# Patient Record
Sex: Female | Born: 1955 | Race: White | Hispanic: No | Marital: Married | Smoking: Never smoker
Health system: Southern US, Community
[De-identification: ages and names within clinical notes are randomized; demographics above are authoritative.]

## PROBLEM LIST (undated history)

## (undated) DIAGNOSIS — M810 Age-related osteoporosis without current pathological fracture: Secondary | ICD-10-CM

## (undated) DIAGNOSIS — C4490 Unspecified malignant neoplasm of skin, unspecified: Secondary | ICD-10-CM

## (undated) DIAGNOSIS — E8809 Other disorders of plasma-protein metabolism, not elsewhere classified: Secondary | ICD-10-CM

## (undated) DIAGNOSIS — M1990 Unspecified osteoarthritis, unspecified site: Secondary | ICD-10-CM

## (undated) DIAGNOSIS — N951 Menopausal and female climacteric states: Secondary | ICD-10-CM

## (undated) DIAGNOSIS — Z973 Presence of spectacles and contact lenses: Secondary | ICD-10-CM

## (undated) HISTORY — PX: OTHER SURGICAL HISTORY: SHX169

## (undated) HISTORY — DX: Unspecified malignant neoplasm of skin, unspecified: C44.90

## (undated) HISTORY — DX: Other disorders of plasma-protein metabolism, not elsewhere classified: E88.09

## (undated) HISTORY — PX: COLONOSCOPY: SHX174

## (undated) HISTORY — DX: Menopausal and female climacteric states: N95.1

## (undated) HISTORY — DX: Age-related osteoporosis without current pathological fracture: M81.0

---

## 1998-03-15 ENCOUNTER — Other Ambulatory Visit: Admission: RE | Admit: 1998-03-15 | Discharge: 1998-03-15 | Payer: Self-pay | Admitting: *Deleted

## 2000-02-05 ENCOUNTER — Other Ambulatory Visit: Admission: RE | Admit: 2000-02-05 | Discharge: 2000-02-05 | Payer: Self-pay | Admitting: *Deleted

## 2002-12-18 ENCOUNTER — Other Ambulatory Visit: Admission: RE | Admit: 2002-12-18 | Discharge: 2002-12-18 | Payer: Self-pay | Admitting: Obstetrics and Gynecology

## 2005-02-04 ENCOUNTER — Other Ambulatory Visit: Admission: RE | Admit: 2005-02-04 | Discharge: 2005-02-04 | Payer: Self-pay | Admitting: Obstetrics and Gynecology

## 2006-10-21 ENCOUNTER — Other Ambulatory Visit: Admission: RE | Admit: 2006-10-21 | Discharge: 2006-10-21 | Payer: Self-pay | Admitting: Obstetrics and Gynecology

## 2007-10-24 ENCOUNTER — Other Ambulatory Visit: Admission: RE | Admit: 2007-10-24 | Discharge: 2007-10-24 | Payer: Self-pay | Admitting: Obstetrics and Gynecology

## 2010-04-01 LAB — HM DEXA SCAN

## 2010-05-23 ENCOUNTER — Other Ambulatory Visit: Payer: Self-pay | Admitting: *Deleted

## 2010-05-23 ENCOUNTER — Ambulatory Visit
Admission: RE | Admit: 2010-05-23 | Discharge: 2010-05-23 | Disposition: A | Discharge status: Home / Self Care | Payer: Self-pay | Source: Ambulatory Visit | Attending: *Deleted | Admitting: *Deleted

## 2010-05-23 DIAGNOSIS — M542 Cervicalgia: Secondary | ICD-10-CM

## 2012-02-26 ENCOUNTER — Ambulatory Visit: Payer: BC Managed Care – PPO

## 2012-02-26 ENCOUNTER — Ambulatory Visit (INDEPENDENT_AMBULATORY_CARE_PROVIDER_SITE_OTHER): Payer: BC Managed Care – PPO | Admitting: Family Medicine

## 2012-02-26 VITALS — BP 131/80 | HR 92 | Temp 98.6°F | Resp 16 | Ht 64.0 in | Wt 114.8 lb

## 2012-02-26 DIAGNOSIS — R05 Cough: Secondary | ICD-10-CM

## 2012-02-26 DIAGNOSIS — R059 Cough, unspecified: Secondary | ICD-10-CM

## 2012-02-26 DIAGNOSIS — J40 Bronchitis, not specified as acute or chronic: Secondary | ICD-10-CM

## 2012-02-26 MED ORDER — HYDROCOD POLST-CHLORPHEN POLST 10-8 MG/5ML PO LQCR: 5.0000 mL | Freq: Two times a day (BID) | ORAL | Status: DC | PRN

## 2012-02-26 MED ORDER — AZITHROMYCIN 250 MG PO TABS: ORAL_TABLET | ORAL | Status: DC

## 2012-02-26 NOTE — Progress Notes (Signed)
  Subjective:    Patient ID: Karen Campos, female    DOB: 1956/02/06, 56 y.o.   MRN: [id number]  HPI 56 year old female presents with 1 week history of cough, nasal congestion, postnasal drip, and chest tightness.  Denies fever, chills, nausea, vomiting, otalgia, sore throat, sinus pain/pressure.  She has been taking Nyquil and Robitussin which have helped some, but is concerned since this has persisted.  She is otherwise healthy with no other complaints today.      Review of Systems  Constitutional: Negative for fever and chills.  HENT: Positive for congestion, rhinorrhea and postnasal drip. Negative for sore throat.   Respiratory: Positive for cough and chest tightness. Negative for shortness of breath and wheezing.   Cardiovascular: Negative for chest pain.  Gastrointestinal: Negative for nausea and vomiting.  All other systems reviewed and are negative.       Objective:   Physical Exam  Constitutional: She is oriented to person, place, and time. She appears well-developed and well-nourished.  HENT:  Head: Normocephalic and atraumatic.  Right Ear: Hearing, tympanic membrane, external ear and ear canal normal.  Left Ear: Hearing, tympanic membrane, external ear and ear canal normal.  Nose: Right sinus exhibits no maxillary sinus tenderness and no frontal sinus tenderness. Left sinus exhibits no maxillary sinus tenderness and no frontal sinus tenderness.  Mouth/Throat: Uvula is midline, oropharynx is clear and moist and mucous membranes are normal. No oropharyngeal exudate.  Eyes: Conjunctivae normal are normal.  Neck: Normal range of motion.  Cardiovascular: Normal rate, regular rhythm and normal heart sounds.   Pulmonary/Chest: Effort normal and breath sounds normal.  Lymphadenopathy:    She has no cervical adenopathy.  Neurological: She is alert and oriented to person, place, and time.  Psychiatric: She has a normal mood and affect. Her behavior is normal. Judgment and  thought content normal.    [facility] reading (PRIMARY) by  Dr. Clelia Croft as normal chest x-ray.        Assessment & Plan:   1. Bronchitis  azithromycin (ZITHROMAX) 250 MG tablet  2. Cough  DG Chest 2 View, chlorpheniramine-HYDROcodone (TUSSIONEX PENNKINETIC ER) 10-8 MG/5ML LQCR  Likely post viral cough Will cover bronchitis with Zpack Tussionex qhs prn cough Increase fluids and rest Follow up if symptoms worsen or fail to improve

## 2012-07-07 NOTE — Progress Notes (Signed)
Reviewed documentation and xray and agree w/ assessment and plan. Aja Whitehair, MD MPH   

## 2012-08-30 ENCOUNTER — Encounter: Payer: Self-pay | Admitting: Nurse Practitioner

## 2012-09-06 ENCOUNTER — Encounter: Payer: Self-pay | Admitting: *Deleted

## 2012-09-13 ENCOUNTER — Other Ambulatory Visit: Payer: BC Managed Care – PPO

## 2012-09-13 ENCOUNTER — Ambulatory Visit (INDEPENDENT_AMBULATORY_CARE_PROVIDER_SITE_OTHER): Payer: BC Managed Care – PPO | Admitting: Nurse Practitioner

## 2012-09-13 ENCOUNTER — Encounter: Payer: Self-pay | Admitting: Nurse Practitioner

## 2012-09-13 ENCOUNTER — Ambulatory Visit: Payer: BC Managed Care – PPO | Admitting: Nurse Practitioner

## 2012-09-13 VITALS — BP 102/60 | HR 68 | Resp 12 | Ht 63.75 in | Wt 117.6 lb

## 2012-09-13 DIAGNOSIS — Z0000 Encounter for general adult medical examination without abnormal findings: Secondary | ICD-10-CM

## 2012-09-13 DIAGNOSIS — E559 Vitamin D deficiency, unspecified: Secondary | ICD-10-CM

## 2012-09-13 DIAGNOSIS — Z01419 Encounter for gynecological examination (general) (routine) without abnormal findings: Secondary | ICD-10-CM

## 2012-09-13 LAB — POCT URINALYSIS DIPSTICK
Leukocytes, UA: NEGATIVE
pH, UA: 6.5

## 2012-09-13 LAB — HEMOGLOBIN, FINGERSTICK: Hemoglobin, fingerstick: 15 g/dL (ref 12.0–16.0)

## 2012-09-13 NOTE — Progress Notes (Signed)
57 y.o. G2P2 Married Caucasian Fe here for annual exam.  She feels well no new health concerns.  Wants to make sure OTC Vit D is not too much; wants recheck level.  No LMP recorded. Patient is postmenopausal.          Sexually active: yes  The current method of family planning is post menopausal status.    Exercising: no  The patient does not participate in regular exercise at present. Smoker:  no  Health Maintenance: Pap:  11/04/2009  Normal  MMG:  04/18/2012 normal Colonoscopy:  2012 normal repeat in 10 years BMD:   2012  TDaP:  2012 Labs: Hgb- 15.0   reports that she has never smoked. She has never used smokeless tobacco. She reports that she does not drink alcohol or use illicit drugs.  Past Medical History  Diagnosis Date  . Perimenopausal     Past Surgical History  Procedure Laterality Date  . Vaginal delivery      x2    Current Outpatient Prescriptions  Medication Sig Dispense Refill  . aspirin 81 MG tablet Take 81 mg by mouth daily.      . calcium carbonate 200 MG capsule Take 250 mg by mouth daily.      . fish oil-omega-3 fatty acids 1000 MG capsule Take 2 g by mouth daily.      Marland Kitchen glucosamine-chondroitin 500-400 MG tablet Take 1 tablet by mouth 3 (three) times daily.      . Multiple Vitamin (MULTIVITAMIN) capsule Take 1 capsule by mouth daily.       No current facility-administered medications for this visit.    Family History  Problem Relation Age of Onset  . Thyroid disease Mother   . Glaucoma Mother   . Heart disease Father   . Heart disease Paternal Aunt   . Breast cancer Paternal Grandmother     ROS:  Pertinent items are noted in HPI.  Otherwise, a comprehensive ROS was negative.  Exam:   BP 102/60  Pulse 68  Resp 12  Ht 5' 3.75" (1.619 m)  Wt 117 lb 9.6 oz (53.343 kg)  BMI 20.35 kg/m2 Height: 5' 3.75" (161.9 cm)  Ht Readings from Last 3 Encounters:  09/13/12 5' 3.75" (1.619 m)  02/26/12 5\' 4"  (1.626 m)    General appearance: alert,  cooperative and appears stated age Head: Normocephalic, without obvious abnormality, atraumatic Neck: no adenopathy, supple, symmetrical, trachea midline and thyroid normal to inspection and palpation Lungs: clear to auscultation bilaterally Breasts: normal appearance, no masses or tenderness Heart: regular rate and rhythm Abdomen: soft, non-tender; no masses,  no organomegaly Extremities: extremities normal, atraumatic, no cyanosis or edema Skin: Skin color, texture, turgor normal. No rashes or lesions Lymph nodes: Cervical, supraclavicular, and axillary nodes normal. No abnormal inguinal nodes palpated Neurologic: Grossly normal   Pelvic: External genitalia:  no lesions              Urethra:  normal appearing urethra with no masses, tenderness or lesions              Bartholin's and Skene's: normal                 Vagina: normal appearing vagina with normal color and discharge, no lesions              Cervix: anteverted              Pap taken: yes with HR HPV Bimanual Exam:  Uterus:  normal size,  contour, position, consistency, mobility, non-tender              Adnexa: no mass, fullness, tenderness               Rectovaginal: Confirms               Anus:  normal sphincter tone, no lesions  A:  Well Woman with normal exam  Postmenopausal no HRT  Osteopenia - decided with PCP not to do treatment at this time secondary to SE  History of Vit D deficiency  P:   Pap smear as per guidelines   Mammogram due 03/2013  Recheck Vit D, follow with pap  counseled on osteoporosis, adequate intake of calcium and vitamin D  diet and exercise, Kegel's exercises return annually or prn  An After Visit Summary was printed and given to the patient.

## 2012-09-13 NOTE — Patient Instructions (Signed)

## 2012-09-15 NOTE — Progress Notes (Signed)
Encounter reviewed by Dr. Cleavon Goldman Silva.  

## 2012-09-16 ENCOUNTER — Telehealth: Payer: Self-pay | Admitting: Orthopedic Surgery

## 2012-09-16 NOTE — Telephone Encounter (Signed)
Message copied by Alfredo Batty on Fri Sep 16, 2012  5:05 PM ------      Message from: Conley Simmonds      Created: Fri Sep 16, 2012  6:29 AM      Regarding: Results       Please inform the patient to reduce her over the counter Vitamin D level.      I do not see a current dosage on her encounter.      A regular multivitamin with Vitamin D in it is likely to be adequate.            ----- Message -----         From: Rhett Bannister, CMA         Sent: 09/13/2012   9:38 AM           To: Lauro Franklin, FNP                   ------

## 2012-09-16 NOTE — Telephone Encounter (Signed)
Spoke with Karen Campos to inform her Vit D was a little high. Karen Campos takes MVI and fish oil, but no Vit D by itself. Should she discontinue one of them to reduce level? Please advise.

## 2012-09-19 NOTE — Telephone Encounter (Signed)
LM on VM for pt to not take MVI for the next few summer months to allow Vit D level to come down some. Pt to call back with any questions.

## 2012-09-19 NOTE — Telephone Encounter (Signed)
Have her to hold the multivitamin as most women's multi will have vit D. At least for the summer months.

## 2012-09-20 ENCOUNTER — Telehealth: Payer: Self-pay | Admitting: *Deleted

## 2012-09-20 NOTE — Telephone Encounter (Signed)
Patient returning Tiffany's call.

## 2012-09-20 NOTE — Telephone Encounter (Signed)
Message copied by Osie Bond on Tue Sep 20, 2012  4:38 PM ------      Message from: Ria Comment R      Created: Mon Sep 19, 2012  1:24 PM       Pap 02 ------

## 2012-09-20 NOTE — Telephone Encounter (Signed)
Pt is aware of vitamin d lab results and is agreeable to dc'd all vitamin d intake (supplements).

## 2012-09-20 NOTE — Telephone Encounter (Signed)
Message copied by Osie Bond on Tue Sep 20, 2012  9:29 AM ------      Message from: Ria Comment R      Created: Mon Sep 19, 2012  1:24 PM       Pap 02 ------

## 2012-09-20 NOTE — Telephone Encounter (Signed)
LVM for pt to return my call in regards to lab results.  

## 2012-09-22 ENCOUNTER — Ambulatory Visit: Payer: BC Managed Care – PPO | Admitting: Nurse Practitioner

## 2013-08-07 ENCOUNTER — Encounter: Payer: Self-pay | Admitting: Nurse Practitioner

## 2013-08-25 ENCOUNTER — Emergency Department (HOSPITAL_BASED_OUTPATIENT_CLINIC_OR_DEPARTMENT_OTHER)
Admission: EM | Admit: 2013-08-25 | Discharge: 2013-08-25 | Disposition: A | Discharge status: Home / Self Care | Payer: BC Managed Care – PPO | Attending: Emergency Medicine | Admitting: Emergency Medicine

## 2013-08-25 ENCOUNTER — Encounter (HOSPITAL_BASED_OUTPATIENT_CLINIC_OR_DEPARTMENT_OTHER): Payer: Self-pay | Admitting: Emergency Medicine

## 2013-08-25 ENCOUNTER — Emergency Department (HOSPITAL_BASED_OUTPATIENT_CLINIC_OR_DEPARTMENT_OTHER): Payer: BC Managed Care – PPO

## 2013-08-25 DIAGNOSIS — Z79899 Other long term (current) drug therapy: Secondary | ICD-10-CM | POA: Insufficient documentation

## 2013-08-25 DIAGNOSIS — Z7982 Long term (current) use of aspirin: Secondary | ICD-10-CM | POA: Insufficient documentation

## 2013-08-25 DIAGNOSIS — M7530 Calcific tendinitis of unspecified shoulder: Secondary | ICD-10-CM

## 2013-08-25 DIAGNOSIS — Z880 Allergy status to penicillin: Secondary | ICD-10-CM | POA: Insufficient documentation

## 2013-08-25 MED ORDER — HYDROCODONE-ACETAMINOPHEN 5-325 MG PO TABS: 2.0000 | ORAL_TABLET | ORAL | Status: DC | PRN

## 2013-08-25 MED ORDER — TRIAMCINOLONE ACETONIDE 40 MG/ML IJ SUSP
INTRAMUSCULAR | Status: AC
  Filled 2013-08-25: qty 5

## 2013-08-25 MED ORDER — TRIAMCINOLONE ACETONIDE 40 MG/ML IJ SUSP
40.0000 mg | Freq: Once | INTRAMUSCULAR | Status: AC
  Administered 2013-08-25: 40 mg via INTRA_ARTICULAR

## 2013-08-25 NOTE — ED Notes (Signed)
Patient transported to X-ray 

## 2013-08-25 NOTE — ED Notes (Signed)
MD at bedside. 

## 2013-08-25 NOTE — ED Notes (Signed)
Pt reports right shoulder pain that started suddenly Wednesday night - denies known injury.

## 2013-08-25 NOTE — Discharge Instructions (Signed)
Calcific Tendinitis Calcific tendinitis occurs when crystals of calcium are deposited in a tendon. Tendons are bands of strong, fibrous tissue that attach muscles to bones. Tendons are an important part of joints. They make the joint move and they absorb some of the stress that a joint receives during use. When calcium is deposited in the tendon, the tendon becomes stiff, painful, and it can become swollen. Calcific tendinitis occurs frequently in the shoulder joint, in a structure called the rotator cuff. CAUSES  The cause of calcific tendinitis is unclear. It may be associated with:  Overuse of the tendon, such as from repetitive motion.  Excess stress on the tendon.  Aging.  Repetitive, mild injuries. SYMPTOMS   Pain may or may not be present. If it is present, it may occur when moving the joint.  Tenderness when pressure is applied to the tendon.  A snapping or popping sound when the joint moves.  Decreased motion of the joint.  Difficulty sleeping due to pain in the joint. DIAGNOSIS  Your caregiver will perform a physical exam. Imaging tests may also be used to make the diagnosis. These may include X-rays, an MRI, or a CT scan. TREATMENT  Generally, calcific tendinitis resolves on its own. Treatment for pain of calcific tendinitis may include:  Taking over-the-counter medicines, such as anti-inflammatory drugs.  Applying ice packs to the joint.  Following a specific exercise program to keep the joint working properly.  Attending physical therapy sessions.  Avoiding activities that cause pain. Treatment for more severe calcific tendinitis may require:  Injecting cortisone steroids or pain relieving medicines into or around the joint.  Manipulating the joint after you are given medicine to numb the area (local anesthetic).  Inflating the joint with sterile fluid to increase the flexibility of the tendons.  Shockwave therapy, which involves focusing sounds waves on the  joint. If other treatments do not work, surgery may be done to clean out the calcium deposits and repair the tendons where needed. Most people do not need surgery. HOME CARE INSTRUCTIONS   Only take over-the-counter or prescription medicines for pain, fever, or discomfort as directed by your caregiver.  Follow your caregiver's recommendations for activity and exercise. SEEK MEDICAL CARE IF:  You notice an increase in pain or numbness.  You develop new weakness.  You notice increased joint stiffness or a sensation of looseness in the joint.  You notice increasing redness, swelling, or warmth around the joint area. SEEK IMMEDIATE MEDICAL CARE IF:  You have a fever or persistent symptoms for more than 2 to 3 days.  You have a fever and your symptoms suddenly get worse. MAKE SURE YOU:  Understand these instructions.  Will watch your condition.  Will get help right away if you are not doing well or get worse. Document Released: 12/24/2007 Document Revised: 09/15/2011 Document Reviewed: 06/25/2011 [facility] Patient Information 2014 [place].

## 2013-08-28 NOTE — ED Provider Notes (Addendum)
CSN: [id number]     Arrival date & time 08/25/13  1000 History   First MD Initiated Contact with Patient 08/25/13 1014     Chief Complaint  Patient presents with  . Shoulder Pain     HPI Patient complains of increasing right shoulder pain which are Wednesday.  No known injury or fall.  No fever or or heat to the area.  Pain primarily with abduction of shoulder. Past Medical History  Diagnosis Date  . Perimenopausal    Past Surgical History  Procedure Laterality Date  . Vaginal delivery      x2   Family History  Problem Relation Age of Onset  . Thyroid disease Mother   . Glaucoma Mother   . Heart disease Father   . Heart disease Paternal Aunt   . Breast cancer Paternal Grandmother    History  Substance Use Topics  . Smoking status: Never Smoker   . Smokeless tobacco: Never Used  . Alcohol Use: No   OB History   Grav Para Term Preterm Abortions TAB SAB Ect Mult Living   2 2        2      Review of Systems  All other systems reviewed and are negative  Allergies  Penicillins  Home Medications   Prior to Admission medications   Medication Sig Start Date End Date Taking? Authorizing Provider  aspirin 81 MG tablet Take 81 mg by mouth daily.   Yes Historical Provider, MD  calcium carbonate 200 MG capsule Take 250 mg by mouth daily.   Yes Historical Provider, MD  estrogens conjugated, synthetic A, (CENESTIN) 0.3 MG tablet Take 0.3 mg by mouth daily.   Yes Historical Provider, MD  fish oil-omega-3 fatty acids 1000 MG capsule Take 2 g by mouth daily.   Yes Historical Provider, MD  glucosamine-chondroitin 500-400 MG tablet Take 1 tablet by mouth 3 (three) times daily.   Yes Historical Provider, MD  Multiple Vitamin (MULTIVITAMIN) capsule Take 1 capsule by mouth daily.   Yes Historical Provider, MD  HYDROcodone-acetaminophen (NORCO/VICODIN) 5-325 MG per tablet Take 2 tablets by mouth every 4 (four) hours as needed. 08/25/13   Dot Lanes, MD   BP 150/88  Pulse 84   Temp(Src) 99 F (37.2 C) (Oral)  Resp 18  Ht 5\' 5"  (1.651 m)  Wt 120 lb (54.432 kg)  BMI 19.97 kg/m2  SpO2 100% Physical Exam  Nursing note and vitals reviewed. Constitutional: She is oriented to person, place, and time. She appears well-developed and well-nourished. No distress.  HENT:  Head: Normocephalic and atraumatic.  Eyes: Pupils are equal, round, and reactive to light.  Neck: Normal range of motion.  Cardiovascular: Normal rate and intact distal pulses.   Pulmonary/Chest: No respiratory distress.  Abdominal: Normal appearance. She exhibits no distension.  Musculoskeletal:       Right shoulder: She exhibits decreased range of motion, tenderness and pain. She exhibits no swelling and no deformity.  Decreased ability to abduct shoulder  Neurological: She is alert and oriented to person, place, and time. No cranial nerve deficit.  Skin: Skin is warm and dry. No rash noted.  Psychiatric: She has a normal mood and affect. Her behavior is normal.    ED Course  Injection tendon or ligament Performed by: Dot Lanes Authorized by: Dot Lanes Consent: Verbal consent obtained. written consent not obtained. Risks and benefits: risks, benefits and alternatives were discussed Consent given by: patient Patient understanding: patient states understanding of the  procedure being performed Patient consent: the patient's understanding of the procedure matches consent given Procedure consent: procedure consent matches procedure scheduled Relevant documents: relevant documents present and verified Test results: test results available and properly labeled Site marked: the operative site was marked Imaging studies: imaging studies available Patient identity confirmed: verbally with patient Time out: Immediately prior to procedure a "time out" was called to verify the correct patient, procedure, equipment, support staff and site/side marked as required. Preparation: Patient was  prepped and draped in the usual sterile fashion. Local anesthesia used: no Patient sedated: no Patient tolerance: Patient tolerated the procedure well with no immediate complications.   (including critical care time)  Medications  triamcinolone acetonide (KENALOG-40) injection 40 mg (40 mg Intra-articular Given by Other 08/25/13 1101)     Labs Review Labs Reviewed - No data to display  Imaging Review IMPRESSION: Evidence of calcific tendinosis laterally. No appreciable joint space narrowing. No fracture or dislocation.     MDM   Final diagnoses:  Calcific tendinitis of shoulder        Dot Lanes, MD 08/28/13 [id number]  Dot Lanes, MD 08/28/13 [id number]

## 2013-09-14 ENCOUNTER — Ambulatory Visit: Payer: BC Managed Care – PPO | Admitting: Nurse Practitioner

## 2013-09-25 ENCOUNTER — Encounter: Payer: Self-pay | Admitting: Nurse Practitioner

## 2013-09-25 ENCOUNTER — Ambulatory Visit (INDEPENDENT_AMBULATORY_CARE_PROVIDER_SITE_OTHER): Payer: BC Managed Care – PPO | Admitting: Nurse Practitioner

## 2013-09-25 VITALS — BP 120/78 | HR 76 | Ht 63.75 in | Wt 120.0 lb

## 2013-09-25 DIAGNOSIS — Z01419 Encounter for gynecological examination (general) (routine) without abnormal findings: Secondary | ICD-10-CM

## 2013-09-25 DIAGNOSIS — Z0000 Encounter for general adult medical examination without abnormal findings: Secondary | ICD-10-CM

## 2013-09-25 DIAGNOSIS — R319 Hematuria, unspecified: Secondary | ICD-10-CM

## 2013-09-25 LAB — POCT URINALYSIS DIPSTICK
Bilirubin, UA: NEGATIVE
GLUCOSE UA: NEGATIVE
Ketones, UA: NEGATIVE
Leukocytes, UA: NEGATIVE
NITRITE UA: NEGATIVE
Protein, UA: NEGATIVE
UROBILINOGEN UA: NEGATIVE
pH, UA: 5

## 2013-09-25 NOTE — Patient Instructions (Signed)

## 2013-09-25 NOTE — Progress Notes (Signed)
58 y.o. G2P2 Married Caucasian Fe here for annual exam. Some vaso symptoms but is better with Black Cohash and Soy.  Uses OTC lubrication. Will return for fasting labs. First grand daughter is now 61 months old.  Patient's last menstrual period was 04/30/2008.          Sexually active: Yes.    The current method of family planning is none.    Exercising: No.  The patient does not participate in regular exercise at present. Smoker:  no  Health Maintenance: Pap:  09/13/12, Normal MMG:  04/18/2012 Colonoscopy:  2012, Normal recheck in 10 years BMD: 04/01/2010  -0.3/-2.1/-1.3 TDaP:  11/04/2009 Labs: pt was not fasting. Will schedule to come back       Urine: RBC-Trace   reports that she has never smoked. She has never used smokeless tobacco. She reports that she does not drink alcohol or use illicit drugs.  Past Medical History  Diagnosis Date  . Perimenopausal     Past Surgical History  Procedure Laterality Date  . Vaginal delivery      x2    Current Outpatient Prescriptions  Medication Sig Dispense Refill  . aspirin 81 MG tablet Take 81 mg by mouth daily.      . Black Cohosh-SoyIsoflav-Magnol (ESTROVEN MENOPAUSE RELIEF) CAPS Take by mouth.      . calcium carbonate 200 MG capsule Take 250 mg by mouth daily.      . fish oil-omega-3 fatty acids 1000 MG capsule Take 2 g by mouth daily.      Marland Kitchen glucosamine-chondroitin 500-400 MG tablet Take 1 tablet by mouth 3 (three) times daily.      . Multiple Vitamin (MULTIVITAMIN) capsule Take 1 capsule by mouth daily.      Marland Kitchen OVER THE COUNTER MEDICATION Take by mouth 2 (two) times daily. Adrenal compound       No current facility-administered medications for this visit.    Family History  Problem Relation Age of Onset  . Thyroid disease Mother   . Glaucoma Mother   . Heart disease Father   . Heart disease Paternal Aunt   . Breast cancer Paternal Grandmother     ROS:  Pertinent items are noted in HPI.  Otherwise, a comprehensive ROS was  negative.  Exam:   BP 120/78  Pulse 76  Ht 5' 3.75" (1.619 m)  Wt 120 lb (54.432 kg)  BMI 20.77 kg/m2  LMP 04/30/2008 Height: 5' 3.75" (161.9 cm)  Ht Readings from Last 3 Encounters:  09/25/13 5' 3.75" (1.619 m)  08/25/13 5\' 5"  (1.651 m)  09/13/12 5' 3.75" (1.619 m)    General appearance: alert, cooperative and appears stated age Head: Normocephalic, without obvious abnormality, atraumatic Neck: no adenopathy, supple, symmetrical, trachea midline and thyroid normal to inspection and palpation Lungs: clear to auscultation bilaterally Breasts: normal appearance, no masses or tenderness Heart: regular rate and rhythm Abdomen: soft, non-tender; no masses,  no organomegaly Extremities: extremities normal, atraumatic, no cyanosis or edema Skin: Skin color, texture, turgor normal. No rashes or lesions Lymph nodes: Cervical, supraclavicular, and axillary nodes normal. No abnormal inguinal nodes palpated Neurologic: Grossly normal   Pelvic: External genitalia:  no lesions              Urethra:  normal appearing urethra with no masses, tenderness or lesions              Bartholin's and Skene's: normal  Vagina: normal appearing vagina with normal color and discharge, no lesions              Cervix: anteverted              Pap taken: No. Bimanual Exam:  Uterus:  normal size, contour, position, consistency, mobility, non-tender              Adnexa: no mass, fullness, tenderness               Rectovaginal: Confirms               Anus:  normal sphincter tone, no lesions  A:  Well Woman with normal exam  Postmenopausal - vaso symptoms are improved on Estroven  P:   Reviewed health and wellness pertinent to exam  Pap smear not taken today  Mammogram is due ? Now - will get records  Return for fasting labs  Counseled on breast self exam, mammography screening, adequate intake of calcium and vitamin D, diet and exercise, Kegel's exercises return annually or prn  An After  Visit Summary was printed and given to the patient.

## 2013-09-26 ENCOUNTER — Other Ambulatory Visit (INDEPENDENT_AMBULATORY_CARE_PROVIDER_SITE_OTHER): Payer: BC Managed Care – PPO

## 2013-09-26 DIAGNOSIS — Z0000 Encounter for general adult medical examination without abnormal findings: Secondary | ICD-10-CM

## 2013-09-26 LAB — URINALYSIS, MICROSCOPIC ONLY
Bacteria, UA: NONE SEEN
CRYSTALS: NONE SEEN
Casts: NONE SEEN
SQUAMOUS EPITHELIAL / LPF: NONE SEEN

## 2013-09-26 LAB — CBC WITH DIFFERENTIAL/PLATELET
Basophils Absolute: 0 10*3/uL (ref 0.0–0.1)
Basophils Relative: 0 % (ref 0–1)
EOS ABS: 0.1 10*3/uL (ref 0.0–0.7)
Eosinophils Relative: 2 % (ref 0–5)
HCT: 43.8 % (ref 36.0–46.0)
HEMOGLOBIN: 15.1 g/dL — AB (ref 12.0–15.0)
LYMPHS ABS: 0.8 10*3/uL (ref 0.7–4.0)
LYMPHS PCT: 20 % (ref 12–46)
MCH: 30.9 pg (ref 26.0–34.0)
MCHC: 34.5 g/dL (ref 30.0–36.0)
MCV: 89.6 fL (ref 78.0–100.0)
MONOS PCT: 8 % (ref 3–12)
Monocytes Absolute: 0.3 10*3/uL (ref 0.1–1.0)
NEUTROS PCT: 70 % (ref 43–77)
Neutro Abs: 2.7 10*3/uL (ref 1.7–7.7)
Platelets: 199 10*3/uL (ref 150–400)
RBC: 4.89 MIL/uL (ref 3.87–5.11)
RDW: 13.4 % (ref 11.5–15.5)
WBC: 3.8 10*3/uL — AB (ref 4.0–10.5)

## 2013-09-26 NOTE — Progress Notes (Signed)
Encounter reviewed by Dr. Brook Silva.  

## 2013-09-27 LAB — LIPID PANEL
CHOL/HDL RATIO: 2.2 ratio
CHOLESTEROL: 211 mg/dL — AB (ref 0–200)
HDL: 96 mg/dL (ref >39)
LDL Cholesterol: 105 mg/dL — ABNORMAL HIGH (ref 0–99)
TRIGLYCERIDES: 48 mg/dL (ref <150)
VLDL: 10 mg/dL (ref 0–40)

## 2013-09-27 LAB — COMPREHENSIVE METABOLIC PANEL
ALBUMIN: 4.8 g/dL (ref 3.5–5.2)
ALK PHOS: 56 U/L (ref 39–117)
ALT: 12 U/L (ref 0–35)
AST: 16 U/L (ref 0–37)
BILIRUBIN TOTAL: 0.6 mg/dL (ref 0.2–1.2)
BUN: 12 mg/dL (ref 6–23)
CO2: 29 mEq/L (ref 19–32)
Calcium: 9.9 mg/dL (ref 8.4–10.5)
Chloride: 104 mEq/L (ref 96–112)
Creat: 0.59 mg/dL (ref 0.50–1.10)
GLUCOSE: 77 mg/dL (ref 70–99)
POTASSIUM: 5.3 meq/L (ref 3.5–5.3)
Sodium: 140 mEq/L (ref 135–145)
Total Protein: 6.5 g/dL (ref 6.0–8.3)

## 2013-09-27 LAB — VITAMIN D 25 HYDROXY (VIT D DEFICIENCY, FRACTURES): Vit D, 25-Hydroxy: 80 ng/mL (ref 30–89)

## 2013-09-27 LAB — URINE CULTURE
Colony Count: NO GROWTH
Organism ID, Bacteria: NO GROWTH

## 2014-01-29 ENCOUNTER — Encounter: Payer: Self-pay | Admitting: Nurse Practitioner

## 2014-09-27 ENCOUNTER — Encounter: Payer: Self-pay | Admitting: Nurse Practitioner

## 2014-09-27 ENCOUNTER — Ambulatory Visit (INDEPENDENT_AMBULATORY_CARE_PROVIDER_SITE_OTHER): Payer: BC Managed Care – PPO | Admitting: Nurse Practitioner

## 2014-09-27 VITALS — BP 130/80 | HR 88 | Resp 16 | Ht 63.75 in | Wt 121.0 lb

## 2014-09-27 DIAGNOSIS — Z01419 Encounter for gynecological examination (general) (routine) without abnormal findings: Secondary | ICD-10-CM

## 2014-09-27 DIAGNOSIS — Z0000 Encounter for general adult medical examination without abnormal findings: Secondary | ICD-10-CM | POA: Diagnosis not present

## 2014-09-27 LAB — POCT URINALYSIS DIPSTICK
BILIRUBIN UA: NEGATIVE
Glucose, UA: NEGATIVE
KETONES UA: NEGATIVE
LEUKOCYTES UA: NEGATIVE
NITRITE UA: NEGATIVE
PH UA: 6.5
Protein, UA: NEGATIVE
RBC UA: NEGATIVE
Spec Grav, UA: 1.02
UROBILINOGEN UA: NEGATIVE

## 2014-09-27 NOTE — Progress Notes (Signed)
Patient ID: Karen Campos, female   DOB: 1955-10-10, 59 y.o.   MRN: [id number] 59 y.o. G2P2 Married Caucasian Fe here for annual exam.  She is doing well without vaso symptoms.  Some vaginal dryness but using OTC lubrication prn.  The grand daughter is now 21 months and doing well.  She did have a skin cancer removed from left shoulder 10/2013.  Patient's last menstrual period was 04/30/2008.          Sexually active: Yes.    The current method of family planning is post menopausal status.    Exercising: No.  The patient does not participate in regular exercise at present. Smoker:  no  Health Maintenance: Pap:  09-13-12 WNL NEG HR HPV MMG:  04-18-12 BI Rads 1 will schedule Colonoscopy:  2011 WNL per patient BMD:   04/01/10 -0.3/ -2.1/-1.3 TDaP:  11-04-09 Labs: labs drawn today Hgb: 14.0  urine: negative   reports that she has never smoked. She has never used smokeless tobacco. She reports that she does not drink alcohol or use illicit drugs.  Past Medical History  Diagnosis Date  . Perimenopausal   . Skin cancer     shoulder-2015    Past Surgical History  Procedure Laterality Date  . Vaginal delivery      x2    Current Outpatient Prescriptions  Medication Sig Dispense Refill  . aspirin 81 MG tablet Take 81 mg by mouth daily.    . Black Cohosh-SoyIsoflav-Magnol (ESTROVEN MENOPAUSE RELIEF) CAPS Take by mouth.    . calcium carbonate 200 MG capsule Take 250 mg by mouth daily.    . fish oil-omega-3 fatty acids 1000 MG capsule Take 2 g by mouth daily.    Marland Kitchen glucosamine-chondroitin 500-400 MG tablet Take 1 tablet by mouth 3 (three) times daily.    . Multiple Vitamin (MULTIVITAMIN) capsule Take 1 capsule by mouth daily.     No current facility-administered medications for this visit.    Family History  Problem Relation Age of Onset  . Thyroid disease Mother   . Glaucoma Mother   . Heart disease Father   . Heart disease Paternal Aunt   . Breast cancer Paternal Grandmother      ROS:  Pertinent items are noted in HPI.  Otherwise, a comprehensive ROS was negative.  Exam:   BP 130/80 mmHg  Pulse 88  Resp 16  Ht 5' 3.75" (1.619 m)  Wt 121 lb (54.885 kg)  BMI 20.94 kg/m2  LMP 04/30/2008 Height: 5' 3.75" (161.9 cm) Ht Readings from Last 3 Encounters:  09/27/14 5' 3.75" (1.619 m)  09/25/13 5' 3.75" (1.619 m)  08/25/13 5\' 5"  (1.651 m)    General appearance: alert, cooperative and appears stated age Head: Normocephalic, without obvious abnormality, atraumatic Neck: no adenopathy, supple, symmetrical, trachea midline and thyroid normal to inspection and palpation Lungs: clear to auscultation bilaterally Breasts: normal appearance, no masses or tenderness Heart: regular rate and rhythm Abdomen: soft, non-tender; no masses,  no organomegaly Extremities: extremities normal, atraumatic, no cyanosis or edema Skin: Skin color, texture, turgor normal. No rashes or lesions Lymph nodes: Cervical, supraclavicular, and axillary nodes normal. No abnormal inguinal nodes palpated Neurologic: Grossly normal   Pelvic: External genitalia:  no lesions              Urethra:  normal appearing urethra with no masses, tenderness or lesions              Bartholin's and Skene's: normal  Vagina: normal appearing vagina with normal color and discharge, no lesions              Cervix: anteverted              Pap taken: No. Bimanual Exam:  Uterus:  normal size, contour, position, consistency, mobility, non-tender              Adnexa: no mass, fullness, tenderness               Rectovaginal: Confirms               Anus:  normal sphincter tone, no lesions  Chaperone present: No  A:  Well Woman with normal exam  Postmenopausal - vaso symptoms are improved on Estroven  Osteopenia hip  P:   Reviewed health and wellness pertinent to exam  Pap smear as above  Mammogram is due now and will schedule  Will follow with labs  Counseled on breast self exam, mammography  screening, adequate intake of calcium and vitamin D, diet and exercise return annually or prn  An After Visit Summary was printed and given to the patient.

## 2014-09-27 NOTE — Patient Instructions (Signed)

## 2014-09-27 NOTE — Progress Notes (Signed)
Encounter reviewed by Dr. Kandise Riehle Amundson C. Silva.  

## 2014-09-28 LAB — COMPREHENSIVE METABOLIC PANEL
ALBUMIN: 4.6 g/dL (ref 3.5–5.2)
ALT: 15 U/L (ref 0–35)
AST: 21 U/L (ref 0–37)
Alkaline Phosphatase: 53 U/L (ref 39–117)
BILIRUBIN TOTAL: 0.8 mg/dL (ref 0.2–1.2)
BUN: 14 mg/dL (ref 6–23)
CHLORIDE: 104 meq/L (ref 96–112)
CO2: 27 meq/L (ref 19–32)
CREATININE: 0.65 mg/dL (ref 0.50–1.10)
Calcium: 9.8 mg/dL (ref 8.4–10.5)
GLUCOSE: 75 mg/dL (ref 70–99)
Potassium: 4.6 mEq/L (ref 3.5–5.3)
SODIUM: 144 meq/L (ref 135–145)
TOTAL PROTEIN: 7 g/dL (ref 6.0–8.3)

## 2014-09-28 LAB — TSH: TSH: 3.562 u[IU]/mL (ref 0.350–4.500)

## 2014-09-28 LAB — VITAMIN D 25 HYDROXY (VIT D DEFICIENCY, FRACTURES): Vit D, 25-Hydroxy: 64 ng/mL (ref 30–100)

## 2014-09-28 LAB — LIPID PANEL
CHOL/HDL RATIO: 2.1 ratio
CHOLESTEROL: 188 mg/dL (ref 0–200)
HDL: 88 mg/dL (ref >=46)
LDL Cholesterol: 87 mg/dL (ref 0–99)
TRIGLYCERIDES: 66 mg/dL (ref <150)
VLDL: 13 mg/dL (ref 0–40)

## 2014-10-02 LAB — HEMOGLOBIN, FINGERSTICK: Hemoglobin, fingerstick: 14 g/dL (ref 12.0–16.0)

## 2015-03-28 ENCOUNTER — Other Ambulatory Visit: Payer: Self-pay | Admitting: Chiropractic Medicine

## 2015-03-28 ENCOUNTER — Ambulatory Visit
Admission: RE | Admit: 2015-03-28 | Discharge: 2015-03-28 | Disposition: A | Discharge status: Home / Self Care | Payer: BC Managed Care – PPO | Source: Ambulatory Visit | Attending: Chiropractic Medicine | Admitting: Chiropractic Medicine

## 2015-03-28 DIAGNOSIS — M545 Low back pain: Secondary | ICD-10-CM

## 2015-03-28 DIAGNOSIS — M25559 Pain in unspecified hip: Secondary | ICD-10-CM

## 2015-03-28 DIAGNOSIS — M542 Cervicalgia: Secondary | ICD-10-CM

## 2015-06-13 ENCOUNTER — Telehealth: Payer: Self-pay | Admitting: *Deleted

## 2015-06-13 NOTE — Telephone Encounter (Signed)
Patient called wanting to speak with Ms. Patty about surgical release form. Wanting to know if she can fill it out, tried to get more info on the form patient said she didn't have it with her to fax. Best # to reach: [phone]

## 2015-06-13 NOTE — Telephone Encounter (Signed)
Left message to call Karen Campos at [phone]. 

## 2015-06-25 NOTE — Telephone Encounter (Signed)
Left message to call Diane Hanel at [phone]. 

## 2015-06-27 NOTE — Telephone Encounter (Signed)
yes

## 2015-06-27 NOTE — Telephone Encounter (Signed)
I have attempted to reach this patient x 2 with no return call. Okay to close encounter? 

## 2015-07-01 ENCOUNTER — Ambulatory Visit: Payer: Self-pay | Admitting: Orthopedic Surgery

## 2015-07-23 ENCOUNTER — Ambulatory Visit: Payer: Self-pay | Admitting: Orthopedic Surgery

## 2015-07-23 NOTE — H&P (Signed)
TOTAL HIP ADMISSION H&P  Patient is admitted for right total hip arthroplasty.  Subjective:  Chief Complaint: right hip pain  HPI: Karen Campos, 60 y.o. female, has a history of pain and functional disability in the right hip(s) due to arthritis and patient has failed non-surgical conservative treatments for greater than 12 weeks to include NSAID's and/or analgesics, flexibility and strengthening excercises, use of assistive devices and activity modification.  Onset of symptoms was abrupt starting 1 years ago with gradually worsening course since that time.The patient noted no past surgery on the right hip(s).  Patient currently rates pain in the right hip at 10 out of 10 with activity. Patient has night pain, worsening of pain with activity and weight bearing, pain that interfers with activities of daily living and pain with passive range of motion. Patient has evidence of subchondral cysts, subchondral sclerosis, periarticular osteophytes and joint space narrowing by imaging studies. This condition presents safety issues increasing the risk of falls. There is no current active infection.  There are no active problems to display for this patient.  Past Medical History  Diagnosis Date  . Perimenopausal   . Skin cancer     shoulder-2015    Past Surgical History  Procedure Laterality Date  . Vaginal delivery      x2     (Not in a hospital admission) Allergies  Allergen Reactions  . Penicillins Hives    Social History  Substance Use Topics  . Smoking status: Never Smoker   . Smokeless tobacco: Never Used  . Alcohol Use: No    Family History  Problem Relation Age of Onset  . Thyroid disease Mother   . Glaucoma Mother   . Heart disease Father   . Heart disease Paternal Aunt   . Breast cancer Paternal Grandmother      Review of Systems  Constitutional: Negative.   HENT: Negative.   Eyes: Negative.   Respiratory: Negative.   Cardiovascular: Negative.   Gastrointestinal:  Negative.   Genitourinary: Positive for frequency.  Musculoskeletal: Positive for joint pain.  Skin: Negative.   Neurological: Negative.   Endo/Heme/Allergies: Negative.   Psychiatric/Behavioral: Negative.     Objective:  Physical Exam  Vitals reviewed. Constitutional: She is oriented to person, place, and time. She appears well-developed and well-nourished.  HENT:  Head: Normocephalic and atraumatic.  Eyes: Conjunctivae and EOM are normal. Pupils are equal, round, and reactive to light.  Neck: Normal range of motion.  Cardiovascular: Normal rate, regular rhythm and normal heart sounds.   Respiratory: Effort normal and breath sounds normal. No respiratory distress.  GI: Soft. Bowel sounds are normal. She exhibits no distension.  Genitourinary:  deferred  Musculoskeletal:       Right hip: She exhibits decreased range of motion.  Neurological: She is alert and oriented to person, place, and time. She has normal reflexes.  Skin: Skin is warm and dry.  Psychiatric: She has a normal mood and affect. Her behavior is normal. Judgment and thought content normal.    Vital signs in last 24 hours: @VSRANGES @  Labs:   Estimated body mass index is 20.94 kg/(m^2) as calculated from the following:   Height as of 09/27/14: 5' 3.75" (1.619 m).   Weight as of 09/27/14: 54.885 kg (121 lb).   Imaging Review Plain radiographs demonstrate severe degenerative joint disease of the right hip(s). The bone quality appears to be adequate for age and reported activity level.  Assessment/Plan:  End stage arthritis, right hip(s)  The  patient history, physical examination, clinical judgement of the provider and imaging studies are consistent with end stage degenerative joint disease of the right hip(s) and total hip arthroplasty is deemed medically necessary. The treatment options including medical management, injection therapy, arthroscopy and arthroplasty were discussed at length. The risks and  benefits of total hip arthroplasty were presented and reviewed. The risks due to aseptic loosening, infection, stiffness, dislocation/subluxation,  thromboembolic complications and other imponderables were discussed.  The patient acknowledged the explanation, agreed to proceed with the plan and consent was signed. Patient is being admitted for inpatient treatment for surgery, pain control, PT, OT, prophylactic antibiotics, VTE prophylaxis, progressive ambulation and ADL's and discharge planning.The patient is planning to be discharged home with home health services

## 2015-07-29 ENCOUNTER — Encounter (HOSPITAL_COMMUNITY): Payer: Self-pay

## 2015-07-29 ENCOUNTER — Encounter (HOSPITAL_COMMUNITY)
Admission: RE | Admit: 2015-07-29 | Discharge: 2015-07-29 | Disposition: A | Discharge status: Home / Self Care | Payer: BC Managed Care – PPO | Source: Ambulatory Visit | Attending: Orthopedic Surgery | Admitting: Orthopedic Surgery

## 2015-07-29 HISTORY — DX: Presence of spectacles and contact lenses: Z97.3

## 2015-07-29 LAB — SURGICAL PCR SCREEN
MRSA, PCR: NEGATIVE
STAPHYLOCOCCUS AUREUS: POSITIVE — AB

## 2015-07-29 LAB — CBC
HEMATOCRIT: 41.2 % (ref 36.0–46.0)
HEMOGLOBIN: 14 g/dL (ref 12.0–15.0)
MCH: 30.8 pg (ref 26.0–34.0)
MCHC: 34 g/dL (ref 30.0–36.0)
MCV: 90.5 fL (ref 78.0–100.0)
Platelets: 214 10*3/uL (ref 150–400)
RBC: 4.55 MIL/uL (ref 3.87–5.11)
RDW: 12.4 % (ref 11.5–15.5)
WBC: 4.1 10*3/uL (ref 4.0–10.5)

## 2015-07-29 LAB — ABO/RH: ABO/RH(D): A POS

## 2015-07-29 LAB — PROTIME-INR
INR: 0.97 (ref 0.00–1.49)
Prothrombin Time: 13.1 seconds (ref 11.6–15.2)

## 2015-07-29 NOTE — Patient Instructions (Signed)
Karen Campos  07/29/2015   Your procedure is scheduled on: Thursday Aug 01, 2015  Report to [facility] Main  Entrance take [place]  elevators to 3rd floor to  [place] at 2:45 PM.  Call this number if you have problems the morning of surgery [phone]   Remember: ONLY 1 PERSON MAY GO WITH YOU TO SHORT STAY TO GET  READY MORNING OF [place].  Do not eat food After Midnight but may take clear liquid diet till 10:45 am day of surgery then nothing by mouth.      Take these medicines the morning of surgery with A SIP OF WATER: NONE                                You may not have any metal on your body including hair pins and              piercings  Do not wear jewelry, make-up, lotions, powders or perfumes, deodorant             Do not wear nail polish.  Do not shave  48 hours prior to surgery.              Do not bring valuables to the hospital. [place].  Contacts, dentures or bridgework may not be worn into surgery.  Leave suitcase in the car. After surgery it may be brought to your room.                Please read over the following fact sheets you were given:INCENTIVE SPIROMETER; BLOOD TRANSFUSION INFORMATION SHEET  _____________________________________________________________________             [facility] - Preparing for Surgery Before surgery, you can play an important role.  Because skin is not sterile, your skin needs to be as free of germs as possible.  You can reduce the number of germs on your skin by washing with CHG (chlorahexidine gluconate) soap before surgery.  CHG is an antiseptic cleaner which kills germs and bonds with the skin to continue killing germs even after washing. Please DO NOT use if you have an allergy to CHG or antibacterial soaps.  If your skin becomes reddened/irritated stop using the CHG and inform your nurse when you arrive at Short Stay. Do not shave (including  legs and underarms) for at least 48 hours prior to the first CHG shower.  You may shave your face/neck. Please follow these instructions carefully:  1.  Shower with CHG Soap the night before surgery and the  morning of Surgery.  2.  If you choose to wash your hair, wash your hair first as usual with your  normal  shampoo.  3.  After you shampoo, rinse your hair and body thoroughly to remove the  shampoo.                           4.  Use CHG as you would any other liquid soap.  You can apply chg directly  to the skin and wash                       Gently  with a scrungie or clean washcloth.  5.  Apply the CHG Soap to your body ONLY FROM THE NECK DOWN.   Do not use on face/ open                           Wound or open sores. Avoid contact with eyes, ears mouth and genitals (private parts).                       Wash face,  Genitals (private parts) with your normal soap.             6.  Wash thoroughly, paying special attention to the area where your surgery  will be performed.  7.  Thoroughly rinse your body with warm water from the neck down.  8.  DO NOT shower/wash with your normal soap after using and rinsing off  the CHG Soap.                9.  Pat yourself dry with a clean towel.            10.  Wear clean pajamas.            11.  Place clean sheets on your bed the night of your first shower and do not  sleep with pets. Day of Surgery : Do not apply any lotions/deodorants the morning of surgery.  Please wear clean clothes to the hospital/surgery center.  FAILURE TO FOLLOW THESE INSTRUCTIONS MAY RESULT IN THE CANCELLATION OF YOUR SURGERY PATIENT SIGNATURE_________________________________  NURSE SIGNATURE__________________________________  ________________________________________________________________________    CLEAR LIQUID DIET   Foods Allowed                                                                     Foods Excluded  Coffee and tea, regular and decaf                              liquids that you cannot  Plain Jell-O in any flavor                                             see through such as: Fruit ices (not with fruit pulp)                                     milk, soups, orange juice  Iced Popsicles                                    All solid food Carbonated beverages, regular and diet                                    Cranberry, grape and apple juices Sports drinks like Gatorade Lightly seasoned clear broth or consume(fat free) Sugar, honey syrup  Sample  Menu Breakfast                                Lunch                                     Supper Cranberry juice                    Beef broth                            Chicken broth Jell-O                                     Grape juice                           Apple juice Coffee or tea                        Jell-O                                      Popsicle                                                Coffee or tea                        Coffee or tea  _____________________________________________________________________    Incentive Spirometer  An incentive spirometer is a tool that can help keep your lungs clear and active. This tool measures how well you are filling your lungs with each breath. Taking long deep breaths may help reverse or decrease the chance of developing breathing (pulmonary) problems (especially infection) following:  A long period of time when you are unable to move or be active. BEFORE THE PROCEDURE   If the spirometer includes an indicator to show your best effort, your nurse or respiratory therapist will set it to a desired goal.  If possible, sit up straight or lean slightly forward. Try not to slouch.  Hold the incentive spirometer in an upright position. INSTRUCTIONS FOR USE   Sit on the edge of your bed if possible, or sit up as far as you can in bed or on a chair.  Hold the incentive spirometer in an upright position.  Breathe out normally.  Place  the mouthpiece in your mouth and seal your lips tightly around it.  Breathe in slowly and as deeply as possible, raising the piston or the ball toward the top of the column.  Hold your breath for 3-5 seconds or for as long as possible. Allow the piston or ball to fall to the bottom of the column.  Remove the mouthpiece from your mouth and breathe out normally.  Rest for a few seconds and repeat Steps 1 through 7 at least 10 times every 1-2 hours when you are awake. Take your time and take a few normal breaths between deep  breaths.  The spirometer may include an indicator to show your best effort. Use the indicator as a goal to work toward during each repetition.  After each set of 10 deep breaths, practice coughing to be sure your lungs are clear. If you have an incision (the cut made at the time of surgery), support your incision when coughing by placing a pillow or rolled up towels firmly against it. Once you are able to get out of bed, walk around indoors and cough well. You may stop using the incentive spirometer when instructed by your caregiver.  RISKS AND COMPLICATIONS  Take your time so you do not get dizzy or light-headed.  If you are in pain, you may need to take or ask for pain medication before doing incentive spirometry. It is harder to take a deep breath if you are having pain. AFTER USE  Rest and breathe slowly and easily.  It can be helpful to keep track of a log of your progress. Your caregiver can provide you with a simple table to help with this. If you are using the spirometer at home, follow these instructions: [place] IF:   You are having difficultly using the spirometer.  You have trouble using the spirometer as often as instructed.  Your pain medication is not giving enough relief while using the spirometer.  You develop fever of 100.5 F (38.1 C) or higher. SEEK IMMEDIATE MEDICAL CARE IF:   You cough up bloody sputum that had not been present  before.  You develop fever of 102 F (38.9 C) or greater.  You develop worsening pain at or near the incision site. MAKE SURE YOU:   Understand these instructions.  Will watch your condition.  Will get help right away if you are not doing well or get worse. Document Released: 07/27/2006 Document Revised: 06/08/2011 Document Reviewed: 09/27/2006 Exit[employer] Patient Information 2014 Exit[employer], [place].   ________________________________________________________________________  WHAT IS A BLOOD TRANSFUSION? Blood Transfusion Information  A transfusion is the replacement of blood or some of its parts. Blood is made up of multiple cells which provide different functions.  Red blood cells carry oxygen and are used for blood loss replacement.  White blood cells fight against infection.  Platelets control bleeding.  Plasma helps clot blood.  Other blood products are available for specialized needs, such as hemophilia or other clotting disorders. BEFORE THE TRANSFUSION  Who gives blood for transfusions?   Healthy volunteers who are fully evaluated to make sure their blood is safe. This is blood bank blood. Transfusion therapy is the safest it has ever been in the practice of medicine. Before blood is taken from a donor, a complete history is taken to make sure that person has no history of diseases nor engages in risky social behavior (examples are intravenous drug use or sexual activity with multiple partners). The donor's travel history is screened to minimize risk of transmitting infections, such as malaria. The donated blood is tested for signs of infectious diseases, such as HIV and hepatitis. The blood is then tested to be sure it is compatible with you in order to minimize the chance of a transfusion reaction. If you or a relative donates blood, this is often done in anticipation of surgery and is not appropriate for emergency situations. It takes many days to process the donated  blood. RISKS AND COMPLICATIONS Although transfusion therapy is very safe and saves many lives, the main dangers of transfusion include:   Getting an infectious disease.  Developing a transfusion reaction. This is an allergic reaction to something in the blood you were given. Every precaution is taken to prevent this. The decision to have a blood transfusion has been considered carefully by your caregiver before blood is given. Blood is not given unless the benefits outweigh the risks. AFTER THE TRANSFUSION  Right after receiving a blood transfusion, you will usually feel much better and more energetic. This is especially true if your red blood cells have gotten low (anemic). The transfusion raises the level of the red blood cells which carry oxygen, and this usually causes an energy increase.  The nurse administering the transfusion will monitor you carefully for complications. HOME CARE INSTRUCTIONS  No special instructions are needed after a transfusion. You may find your energy is better. Speak with your caregiver about any limitations on activity for underlying diseases you may have. SEEK MEDICAL CARE IF:   Your condition is not improving after your transfusion.  You develop redness or irritation at the intravenous (IV) site. SEEK IMMEDIATE MEDICAL CARE IF:  Any of the following symptoms occur over the next 12 hours:  Shaking chills.  You have a temperature by mouth above 102 F (38.9 C), not controlled by medicine.  Chest, back, or muscle pain.  People around you feel you are not acting correctly or are confused.  Shortness of breath or difficulty breathing.  Dizziness and fainting.  You get a rash or develop hives.  You have a decrease in urine output.  Your urine turns a dark color or changes to pink, red, or brown. Any of the following symptoms occur over the next 10 days:  You have a temperature by mouth above 102 F (38.9 C), not controlled by  medicine.  Shortness of breath.  Weakness after normal activity.  The white part of the eye turns yellow (jaundice).  You have a decrease in the amount of urine or are urinating less often.  Your urine turns a dark color or changes to pink, red, or brown. Document Released: 03/13/2000 Document Revised: 06/08/2011 Document Reviewed: 10/31/2007 [facility] Patient Information 2014 [place], [place].  _______________________________________________________________________

## 2015-07-29 NOTE — Progress Notes (Signed)
Clearance notes per chart per Dr Renelda Mom DDS 04/15/2015 and Dr Nancy Fetter 06/19/2015  EKG per chart 06/19/2015

## 2015-07-30 NOTE — Progress Notes (Signed)
PCR positive for Staph. Results sent to Dr Lyla Glassing and noted on OR schedule. Limited time for pt to preform Mupriocin Ointment regimen will need nasal betadine swab morning of surgery.

## 2015-08-01 ENCOUNTER — Inpatient Hospital Stay (HOSPITAL_COMMUNITY): Payer: BC Managed Care – PPO

## 2015-08-01 ENCOUNTER — Encounter (HOSPITAL_COMMUNITY)
Admission: RE | Disposition: A | Discharge status: Home Health | Payer: Self-pay | Source: Ambulatory Visit | Attending: Orthopedic Surgery

## 2015-08-01 ENCOUNTER — Inpatient Hospital Stay (HOSPITAL_COMMUNITY)
Admission: RE | Admit: 2015-08-01 | Discharge: 2015-08-02 | DRG: 470 | Disposition: A | Discharge status: Home Health | Payer: BC Managed Care – PPO | Source: Ambulatory Visit | Attending: Orthopedic Surgery | Admitting: Orthopedic Surgery

## 2015-08-01 ENCOUNTER — Inpatient Hospital Stay (HOSPITAL_COMMUNITY): Payer: BC Managed Care – PPO | Admitting: Anesthesiology

## 2015-08-01 ENCOUNTER — Encounter (HOSPITAL_COMMUNITY): Payer: Self-pay | Admitting: *Deleted

## 2015-08-01 DIAGNOSIS — Z85828 Personal history of other malignant neoplasm of skin: Secondary | ICD-10-CM | POA: Diagnosis not present

## 2015-08-01 DIAGNOSIS — Z83511 Family history of glaucoma: Secondary | ICD-10-CM | POA: Diagnosis not present

## 2015-08-01 DIAGNOSIS — Z8249 Family history of ischemic heart disease and other diseases of the circulatory system: Secondary | ICD-10-CM | POA: Diagnosis not present

## 2015-08-01 DIAGNOSIS — M1611 Unilateral primary osteoarthritis, right hip: Secondary | ICD-10-CM | POA: Diagnosis present

## 2015-08-01 DIAGNOSIS — Z803 Family history of malignant neoplasm of breast: Secondary | ICD-10-CM

## 2015-08-01 DIAGNOSIS — M1612 Unilateral primary osteoarthritis, left hip: Secondary | ICD-10-CM | POA: Diagnosis present

## 2015-08-01 DIAGNOSIS — Z880 Allergy status to penicillin: Secondary | ICD-10-CM | POA: Diagnosis not present

## 2015-08-01 DIAGNOSIS — Z419 Encounter for procedure for purposes other than remedying health state, unspecified: Secondary | ICD-10-CM

## 2015-08-01 DIAGNOSIS — Z09 Encounter for follow-up examination after completed treatment for conditions other than malignant neoplasm: Secondary | ICD-10-CM

## 2015-08-01 HISTORY — PX: TOTAL HIP ARTHROPLASTY: SHX124

## 2015-08-01 LAB — TYPE AND SCREEN
ABO/RH(D): A POS
ANTIBODY SCREEN: NEGATIVE

## 2015-08-01 SURGERY — ARTHROPLASTY, HIP, TOTAL, ANTERIOR APPROACH
Anesthesia: Spinal | Site: Hip | Laterality: Right

## 2015-08-01 MED ORDER — HYDROMORPHONE HCL 1 MG/ML IJ SOLN
INTRAMUSCULAR | Status: AC
  Filled 2015-08-01: qty 1

## 2015-08-01 MED ORDER — DEXAMETHASONE SODIUM PHOSPHATE 10 MG/ML IJ SOLN
INTRAMUSCULAR | Status: DC | PRN
  Administered 2015-08-01: 10 mg via INTRAVENOUS

## 2015-08-01 MED ORDER — SODIUM CHLORIDE 0.9 % IR SOLN
Status: DC | PRN
  Administered 2015-08-01: 3000 mL

## 2015-08-01 MED ORDER — BUPIVACAINE HCL (PF) 0.5 % IJ SOLN
INTRAMUSCULAR | Status: AC
  Filled 2015-08-01: qty 30

## 2015-08-01 MED ORDER — CHLORHEXIDINE GLUCONATE 4 % EX LIQD: 60.0000 mL | Freq: Once | CUTANEOUS | Status: DC

## 2015-08-01 MED ORDER — CALCIUM CARBONATE-VITAMIN D 500-200 MG-UNIT PO TABS
1.0000 | ORAL_TABLET | Freq: Every day | ORAL | Status: DC
  Filled 2015-08-01: qty 1

## 2015-08-01 MED ORDER — MIDAZOLAM HCL 2 MG/2ML IJ SOLN
INTRAMUSCULAR | Status: AC
  Filled 2015-08-01: qty 2

## 2015-08-01 MED ORDER — KETOROLAC TROMETHAMINE 30 MG/ML IJ SOLN
INTRAMUSCULAR | Status: AC
  Filled 2015-08-01: qty 1

## 2015-08-01 MED ORDER — POVIDONE-IODINE 10 % EX SWAB
2.0000 "application " | Freq: Once | CUTANEOUS | Status: AC
  Administered 2015-08-01: 2 via TOPICAL

## 2015-08-01 MED ORDER — VANCOMYCIN HCL IN DEXTROSE 1-5 GM/200ML-% IV SOLN
1000.0000 mg | INTRAVENOUS | Status: AC
Start: 2015-08-02 — End: 2015-08-01
  Administered 2015-08-01: 1000 mg via INTRAVENOUS
  Filled 2015-08-01: qty 200

## 2015-08-01 MED ORDER — METHOCARBAMOL 1000 MG/10ML IJ SOLN
500.0000 mg | Freq: Four times a day (QID) | INTRAVENOUS | Status: DC | PRN
  Administered 2015-08-01: 500 mg via INTRAVENOUS
  Filled 2015-08-01: qty 550
  Filled 2015-08-01: qty 5

## 2015-08-01 MED ORDER — SENNA 8.6 MG PO TABS: 2.0000 | ORAL_TABLET | Freq: Every day | ORAL | Status: DC

## 2015-08-01 MED ORDER — ACETAMINOPHEN 10 MG/ML IV SOLN
INTRAVENOUS | Status: DC | PRN
  Administered 2015-08-01: 1000 mg via INTRAVENOUS

## 2015-08-01 MED ORDER — PHENYLEPHRINE 40 MCG/ML (10ML) SYRINGE FOR IV PUSH (FOR BLOOD PRESSURE SUPPORT)
PREFILLED_SYRINGE | INTRAVENOUS | Status: AC
  Filled 2015-08-01: qty 10

## 2015-08-01 MED ORDER — ASPIRIN EC 81 MG PO TBEC
81.0000 mg | DELAYED_RELEASE_TABLET | Freq: Two times a day (BID) | ORAL | Status: DC
  Administered 2015-08-02: 81 mg via ORAL
  Filled 2015-08-01 (×3): qty 1

## 2015-08-01 MED ORDER — SODIUM CHLORIDE 0.9 % IV SOLN
INTRAVENOUS | Status: DC
  Administered 2015-08-01: 15:00:00 via INTRAVENOUS

## 2015-08-01 MED ORDER — METHOCARBAMOL 500 MG PO TABS
500.0000 mg | ORAL_TABLET | Freq: Four times a day (QID) | ORAL | Status: DC | PRN
  Administered 2015-08-02 (×2): 500 mg via ORAL
  Filled 2015-08-01 (×2): qty 1

## 2015-08-01 MED ORDER — ONDANSETRON HCL 4 MG/2ML IJ SOLN
INTRAMUSCULAR | Status: DC | PRN
  Administered 2015-08-01: 4 mg via INTRAVENOUS

## 2015-08-01 MED ORDER — KETOROLAC TROMETHAMINE 15 MG/ML IJ SOLN
15.0000 mg | Freq: Four times a day (QID) | INTRAMUSCULAR | Status: DC
  Administered 2015-08-01 – 2015-08-02 (×2): 15 mg via INTRAVENOUS
  Filled 2015-08-01 (×4): qty 1

## 2015-08-01 MED ORDER — PHENYLEPHRINE HCL 10 MG/ML IJ SOLN
INTRAMUSCULAR | Status: DC | PRN
  Administered 2015-08-01 (×3): 80 ug via INTRAVENOUS

## 2015-08-01 MED ORDER — HYDROGEN PEROXIDE 3 % EX SOLN
CUTANEOUS | Status: AC
  Filled 2015-08-01: qty 473

## 2015-08-01 MED ORDER — ONDANSETRON HCL 4 MG/2ML IJ SOLN
4.0000 mg | Freq: Four times a day (QID) | INTRAMUSCULAR | Status: DC | PRN
  Filled 2015-08-01: qty 2

## 2015-08-01 MED ORDER — CHLORHEXIDINE GLUCONATE 4 % EX LIQD
60.0000 mL | Freq: Once | CUTANEOUS | Status: DC
Start: 2015-08-01 — End: 2015-08-01

## 2015-08-01 MED ORDER — LIDOCAINE HCL (CARDIAC) 20 MG/ML IV SOLN
INTRAVENOUS | Status: AC
  Filled 2015-08-01: qty 5

## 2015-08-01 MED ORDER — TRANEXAMIC ACID 1000 MG/10ML IV SOLN
1000.0000 mg | INTRAVENOUS | Status: AC
  Administered 2015-08-01: 1000 mg via INTRAVENOUS
  Filled 2015-08-01: qty 10

## 2015-08-01 MED ORDER — BUPIVACAINE-EPINEPHRINE (PF) 0.25% -1:200000 IJ SOLN
INTRAMUSCULAR | Status: AC
  Filled 2015-08-01: qty 30

## 2015-08-01 MED ORDER — ACETAMINOPHEN 10 MG/ML IV SOLN
INTRAVENOUS | Status: AC
  Filled 2015-08-01: qty 100

## 2015-08-01 MED ORDER — ACETAMINOPHEN 650 MG RE SUPP: 650.0000 mg | Freq: Four times a day (QID) | RECTAL | Status: DC | PRN

## 2015-08-01 MED ORDER — DOCUSATE SODIUM 100 MG PO CAPS
100.0000 mg | ORAL_CAPSULE | Freq: Two times a day (BID) | ORAL | Status: DC
  Administered 2015-08-02: 100 mg via ORAL

## 2015-08-01 MED ORDER — HYDROCODONE-ACETAMINOPHEN 5-325 MG PO TABS
1.0000 | ORAL_TABLET | ORAL | Status: DC | PRN
  Administered 2015-08-02 (×3): 1 via ORAL
  Filled 2015-08-01: qty 1
  Filled 2015-08-01: qty 2
  Filled 2015-08-01: qty 1

## 2015-08-01 MED ORDER — PROPOFOL 10 MG/ML IV BOLUS
INTRAVENOUS | Status: AC
  Filled 2015-08-01: qty 40

## 2015-08-01 MED ORDER — ACETAMINOPHEN 325 MG PO TABS: 650.0000 mg | ORAL_TABLET | Freq: Four times a day (QID) | ORAL | Status: DC | PRN

## 2015-08-01 MED ORDER — FENTANYL CITRATE (PF) 100 MCG/2ML IJ SOLN
INTRAMUSCULAR | Status: DC | PRN
  Administered 2015-08-01: 75 ug via INTRAVENOUS
  Administered 2015-08-01: 25 ug via INTRAVENOUS

## 2015-08-01 MED ORDER — ISOPROPYL ALCOHOL 70 % SOLN
Status: DC | PRN
  Administered 2015-08-01: 1 via TOPICAL

## 2015-08-01 MED ORDER — CLINDAMYCIN PHOSPHATE 900 MG/50ML IV SOLN
900.0000 mg | INTRAVENOUS | Status: AC
  Administered 2015-08-01: 900 mg via INTRAVENOUS

## 2015-08-01 MED ORDER — ONDANSETRON HCL 4 MG PO TABS: 4.0000 mg | ORAL_TABLET | Freq: Four times a day (QID) | ORAL | Status: DC | PRN

## 2015-08-01 MED ORDER — METOCLOPRAMIDE HCL 10 MG PO TABS: 5.0000 mg | ORAL_TABLET | Freq: Three times a day (TID) | ORAL | Status: DC | PRN

## 2015-08-01 MED ORDER — ONDANSETRON HCL 4 MG/2ML IJ SOLN
INTRAMUSCULAR | Status: AC
  Filled 2015-08-01: qty 2

## 2015-08-01 MED ORDER — HYDROGEN PEROXIDE 3 % EX SOLN
CUTANEOUS | Status: DC | PRN
  Administered 2015-08-01: 1

## 2015-08-01 MED ORDER — PROPOFOL 500 MG/50ML IV EMUL
INTRAVENOUS | Status: DC | PRN
  Administered 2015-08-01: 75 ug/kg/min via INTRAVENOUS

## 2015-08-01 MED ORDER — PROPOFOL 10 MG/ML IV BOLUS
INTRAVENOUS | Status: DC | PRN
  Administered 2015-08-01: 20 mg via INTRAVENOUS

## 2015-08-01 MED ORDER — SODIUM CHLORIDE 0.9 % IJ SOLN
INTRAMUSCULAR | Status: AC
  Filled 2015-08-01: qty 50

## 2015-08-01 MED ORDER — MEPERIDINE HCL 50 MG/ML IJ SOLN: 6.2500 mg | INTRAMUSCULAR | Status: DC | PRN

## 2015-08-01 MED ORDER — PROPOFOL 10 MG/ML IV BOLUS
INTRAVENOUS | Status: AC
  Filled 2015-08-01: qty 20

## 2015-08-01 MED ORDER — CLINDAMYCIN PHOSPHATE 900 MG/50ML IV SOLN
INTRAVENOUS | Status: AC
  Filled 2015-08-01: qty 50

## 2015-08-01 MED ORDER — BUPIVACAINE-EPINEPHRINE 0.25% -1:200000 IJ SOLN
INTRAMUSCULAR | Status: DC | PRN
  Administered 2015-08-01: 30 mL

## 2015-08-01 MED ORDER — VANCOMYCIN HCL IN DEXTROSE 1-5 GM/200ML-% IV SOLN
1000.0000 mg | Freq: Two times a day (BID) | INTRAVENOUS | Status: AC
  Administered 2015-08-02: 1000 mg via INTRAVENOUS
  Filled 2015-08-01: qty 200

## 2015-08-01 MED ORDER — HYDROMORPHONE HCL 1 MG/ML IJ SOLN: 0.5000 mg | INTRAMUSCULAR | Status: DC | PRN

## 2015-08-01 MED ORDER — LACTATED RINGERS IV SOLN
INTRAVENOUS | Status: DC | PRN
  Administered 2015-08-01 (×3): via INTRAVENOUS

## 2015-08-01 MED ORDER — PHENYLEPHRINE HCL 10 MG/ML IJ SOLN
INTRAMUSCULAR | Status: AC
  Filled 2015-08-01: qty 2

## 2015-08-01 MED ORDER — METOCLOPRAMIDE HCL 5 MG/ML IJ SOLN: 5.0000 mg | Freq: Three times a day (TID) | INTRAMUSCULAR | Status: DC | PRN

## 2015-08-01 MED ORDER — BUPIVACAINE HCL (PF) 0.5 % IJ SOLN
INTRAMUSCULAR | Status: DC | PRN
  Administered 2015-08-01: 3 mL

## 2015-08-01 MED ORDER — DEXAMETHASONE SODIUM PHOSPHATE 10 MG/ML IJ SOLN
10.0000 mg | Freq: Once | INTRAMUSCULAR | Status: AC
  Administered 2015-08-02: 10 mg via INTRAVENOUS
  Filled 2015-08-01 (×2): qty 1

## 2015-08-01 MED ORDER — KETOROLAC TROMETHAMINE 30 MG/ML IJ SOLN
INTRAMUSCULAR | Status: DC | PRN
  Administered 2015-08-01: 30 mg

## 2015-08-01 MED ORDER — SODIUM CHLORIDE 0.9 % IV SOLN
INTRAVENOUS | Status: DC
  Administered 2015-08-01: via INTRAVENOUS

## 2015-08-01 MED ORDER — MENTHOL 3 MG MT LOZG: 1.0000 | LOZENGE | OROMUCOSAL | Status: DC | PRN

## 2015-08-01 MED ORDER — MIDAZOLAM HCL 5 MG/5ML IJ SOLN
INTRAMUSCULAR | Status: DC | PRN
  Administered 2015-08-01: 2 mg via INTRAVENOUS

## 2015-08-01 MED ORDER — HYDROMORPHONE HCL 1 MG/ML IJ SOLN
0.2500 mg | INTRAMUSCULAR | Status: DC | PRN
  Administered 2015-08-01: 0.5 mg via INTRAVENOUS

## 2015-08-01 MED ORDER — MELATONIN 3 MG PO CAPS: 1.0000 | ORAL_CAPSULE | Freq: Every day | ORAL | Status: DC

## 2015-08-01 MED ORDER — FENTANYL CITRATE (PF) 100 MCG/2ML IJ SOLN
INTRAMUSCULAR | Status: AC
  Filled 2015-08-01: qty 2

## 2015-08-01 MED ORDER — SODIUM CHLORIDE 0.9 % IJ SOLN
INTRAMUSCULAR | Status: DC | PRN
  Administered 2015-08-01: 30 mL

## 2015-08-01 MED ORDER — ISOPROPYL ALCOHOL 70 % SOLN
Status: AC
  Filled 2015-08-01: qty 480

## 2015-08-01 MED ORDER — TRANEXAMIC ACID 1000 MG/10ML IV SOLN
1000.0000 mg | Freq: Once | INTRAVENOUS | Status: AC
  Administered 2015-08-01: 1000 mg via INTRAVENOUS
  Filled 2015-08-01: qty 10

## 2015-08-01 MED ORDER — PHENOL 1.4 % MT LIQD: 1.0000 | OROMUCOSAL | Status: DC | PRN

## 2015-08-01 SURGICAL SUPPLY — 42 items
BAG DECANTER FOR FLEXI CONT (MISCELLANEOUS) IMPLANT
BAG SPEC THK2 15X12 ZIP CLS (MISCELLANEOUS)
BAG ZIPLOCK 12X15 (MISCELLANEOUS) IMPLANT
CAPT HIP TOTAL 2 ×2 IMPLANT
CHLORAPREP W/TINT 26ML (MISCELLANEOUS) ×3 IMPLANT
CLOTH BEACON ORANGE TIMEOUT ST (SAFETY) ×3 IMPLANT
COVER PERINEAL POST (MISCELLANEOUS) ×3 IMPLANT
DECANTER SPIKE VIAL GLASS SM (MISCELLANEOUS) ×3 IMPLANT
DRAPE LG THREE QUARTER DISP (DRAPES) ×6 IMPLANT
DRAPE STERI IOBAN 125X83 (DRAPES) ×3 IMPLANT
DRAPE U-SHAPE 47X51 STRL (DRAPES) ×6 IMPLANT
DRSG AQUACEL AG ADV 3.5X10 (GAUZE/BANDAGES/DRESSINGS) ×3 IMPLANT
ELECT REM PT RETURN 15FT ADLT (MISCELLANEOUS) ×3 IMPLANT
GAUZE SPONGE 4X4 12PLY STRL (GAUZE/BANDAGES/DRESSINGS) ×3 IMPLANT
GLOVE BIO SURGEON STRL SZ8.5 (GLOVE) ×6 IMPLANT
GLOVE BIOGEL PI IND STRL 8.5 (GLOVE) ×1 IMPLANT
GLOVE BIOGEL PI INDICATOR 8.5 (GLOVE) ×2
GOWN SPEC L3 XXLG W/TWL (GOWN DISPOSABLE) ×3 IMPLANT
HANDPIECE INTERPULSE COAX TIP (DISPOSABLE) ×3
HOLDER FOLEY CATH W/STRAP (MISCELLANEOUS) ×3 IMPLANT
HOOD PEEL AWAY FLYTE STAYCOOL (MISCELLANEOUS) ×6 IMPLANT
LIQUID BAND (GAUZE/BANDAGES/DRESSINGS) ×6 IMPLANT
MARKER SKIN DUAL TIP RULER LAB (MISCELLANEOUS) ×3 IMPLANT
NDL SPNL 18GX3.5 QUINCKE PK (NEEDLE) ×1 IMPLANT
NEEDLE SPNL 18GX3.5 QUINCKE PK (NEEDLE) ×3 IMPLANT
PACK ANTERIOR HIP CUSTOM (KITS) ×3 IMPLANT
SAW OSC TIP CART 19.5X105X1.3 (SAW) ×3 IMPLANT
SEALER BIPOLAR AQUA 6.0 (INSTRUMENTS) ×3 IMPLANT
SET HNDPC FAN SPRY TIP SCT (DISPOSABLE) ×1 IMPLANT
SOL PREP POV-IOD 4OZ 10% (MISCELLANEOUS) ×3 IMPLANT
SUT ETHIBOND NAB CT1 #1 30IN (SUTURE) ×6 IMPLANT
SUT MNCRL AB 3-0 PS2 18 (SUTURE) ×3 IMPLANT
SUT MON AB 2-0 CT1 36 (SUTURE) ×6 IMPLANT
SUT VIC AB 1 CT1 36 (SUTURE) ×3 IMPLANT
SUT VIC AB 2-0 CT1 27 (SUTURE) ×3
SUT VIC AB 2-0 CT1 TAPERPNT 27 (SUTURE) ×1 IMPLANT
SUT VLOC 180 0 24IN GS25 (SUTURE) ×3 IMPLANT
SYR 50ML LL SCALE MARK (SYRINGE) ×3 IMPLANT
TRAY FOLEY W/METER SILVER 14FR (SET/KITS/TRAYS/PACK) IMPLANT
TRAY FOLEY W/METER SILVER 16FR (SET/KITS/TRAYS/PACK) IMPLANT
WATER STERILE IRR 1500ML POUR (IV SOLUTION) ×3 IMPLANT
YANKAUER SUCT BULB TIP 10FT TU (MISCELLANEOUS) ×3 IMPLANT

## 2015-08-01 NOTE — Anesthesia Postprocedure Evaluation (Addendum)
Anesthesia Post Note  Patient: Karen Campos [facility]  Procedure(s) Performed: Procedure(s) (LRB): RIGHT TOTAL HIP ARTHROPLASTY ANTERIOR APPROACH (Right)  Patient location during evaluation: PACU Anesthesia Type: Spinal and MAC Level of consciousness: awake and alert Pain management: pain level controlled Vital Signs Assessment: post-procedure vital signs reviewed and stable Respiratory status: spontaneous breathing and respiratory function stable Cardiovascular status: blood pressure returned to baseline and stable Postop Assessment: spinal receding Anesthetic complications: no    Last Vitals:  Filed Vitals:   08/01/15 2045 08/01/15 2100  BP: 107/70 102/63  Pulse: 62 63  Temp:    Resp: 11 15    Last Pain: There were no vitals filed for this visit.               Nolon Nations

## 2015-08-01 NOTE — Anesthesia Procedure Notes (Signed)
Spinal Patient location during procedure: OR Start time: 08/01/2015 5:16 PM End time: 08/01/2015 5:30 PM Staffing Anesthesiologist: Nolon Nations Resident/CRNA: Shandale Malak Performed by: anesthesiologist  Preanesthetic Checklist Completed: patient identified, site marked, surgical consent, pre-op evaluation, timeout performed, IV checked, risks and benefits discussed and monitors and equipment checked Spinal Block Patient position: sitting Prep: Betadine Patient monitoring: heart rate, cardiac monitor, continuous pulse ox and blood pressure Approach: midline Location: L3-4 Injection technique: single-shot Needle Needle type: Sprotte  Needle gauge: 24 G Needle length: 9 cm Needle insertion depth: 7 cm Assessment Sensory level: T6 Additional Notes -heme, -para, VSS.  Pt tolerated well.  Expiration and lot number checked.

## 2015-08-01 NOTE — Interval H&P Note (Signed)
History and Physical Interval Note:  08/01/2015 5:06 PM  Glendora Score  has presented today for surgery, with the diagnosis of [place]   The various methods of treatment have been discussed with the patient and family. After consideration of risks, benefits and other options for treatment, the patient has consented to  Procedure(s): RIGHT TOTAL HIP ARTHROPLASTY ANTERIOR APPROACH (Right) as a surgical intervention .  The patient's history has been reviewed, patient examined, no change in status, stable for surgery.  I have reviewed the patient's chart and labs.  Questions were answered to the patient's satisfaction.     Katina Remick, Horald Pollen

## 2015-08-01 NOTE — Discharge Instructions (Signed)
°Dr. Italo Banton °Joint Replacement Specialist °[facility] °[address] °[place], [place] [address] °[phone] ° ° °TOTAL HIP REPLACEMENT POSTOPERATIVE DIRECTIONS ° ° ° °Hip Rehabilitation, Guidelines Following Surgery  ° °WEIGHT BEARING °Weight bearing as tolerated with assist device (walker, cane, etc) as directed, use it as long as suggested by your surgeon or therapist, typically at least 4-6 weeks. ° °The results of a hip operation are greatly improved after range of motion and muscle strengthening exercises. Follow all safety measures which are given to protect your hip. If any of these exercises cause increased pain or swelling in your joint, decrease the amount until you are comfortable again. Then slowly increase the exercises. Call your caregiver if you have problems or questions.  ° °HOME CARE INSTRUCTIONS  °Most of the following instructions are designed to prevent the dislocation of your new hip.  °Remove items at home which could result in a fall. This includes throw rugs or furniture in walking pathways.  °Continue medications as instructed at time of discharge. °· You may have some home medications which will be placed on hold until you complete the course of blood thinner medication. °· You may start showering once you are discharged home. Do not remove your dressing. °Do not put on socks or shoes without following the instructions of your caregivers.   °Sit on chairs with arms. Use the chair arms to help push yourself up when arising.  °Arrange for the use of a toilet seat elevator so you are not sitting low.  °· Walk with walker as instructed.  °You may resume a sexual relationship in one month or when given the OK by your caregiver.  °Use walker as long as suggested by your caregivers.  °You may put full weight on your legs and walk as much as is comfortable. °Avoid periods of inactivity such as sitting longer than an hour when not asleep. This helps prevent  blood clots.  °You may return to work once you are cleared by your surgeon.  °Do not drive a car for 6 weeks or until released by your surgeon.  °Do not drive while taking narcotics.  °Wear elastic stockings for two weeks following surgery during the day but you may remove then at night.  °Make sure you keep all of your appointments after your operation with all of your doctors and caregivers. You should call the office at the above phone number and make an appointment for approximately two weeks after the date of your surgery. °Please pick up a stool softener and laxative for home use as long as you are requiring pain medications. °· ICE to the affected hip every three hours for 30 minutes at a time and then as needed for pain and swelling. Continue to use ice on the hip for pain and swelling from surgery. You may notice swelling that will progress down to the foot and ankle.  This is normal after surgery.  Elevate the leg when you are not up walking on it.   °It is important for you to complete the blood thinner medication as prescribed by your doctor. °· Continue to use the breathing machine which will help keep your temperature down.  It is common for your temperature to cycle up and down following surgery, especially at night when you are not up moving around and exerting yourself.  The breathing machine keeps your lungs expanded and your temperature down. ° °RANGE OF MOTION AND STRENGTHENING EXERCISES  °These exercises are   designed to help you keep full movement of your hip joint. Follow your caregiver's or physical therapist's instructions. Perform all exercises about fifteen times, three times per day or as directed. Exercise both hips, even if you have had only one joint replacement. These exercises can be done on a training (exercise) mat, on the floor, on a table or on a bed. Use whatever works the best and is most comfortable for you. Use music or television while you are exercising so that the exercises  are a pleasant break in your day. This will make your life better with the exercises acting as a break in routine you can look forward to.  °Lying on your back, slowly slide your foot toward your buttocks, raising your knee up off the floor. Then slowly slide your foot back down until your leg is straight again.  °Lying on your back spread your legs as far apart as you can without causing discomfort.  °Lying on your side, raise your upper leg and foot straight up from the floor as far as is comfortable. Slowly lower the leg and repeat.  °Lying on your back, tighten up the muscle in the front of your thigh (quadriceps muscles). You can do this by keeping your leg straight and trying to raise your heel off the floor. This helps strengthen the largest muscle supporting your knee.  °Lying on your back, tighten up the muscles of your buttocks both with the legs straight and with the knee bent at a comfortable angle while keeping your heel on the floor.  ° °SKILLED REHAB INSTRUCTIONS: °If the patient is transferred to a skilled rehab facility following release from the hospital, a list of the current medications will be sent to the facility for the patient to continue.  When discharged from the skilled rehab facility, please have the facility set up the patient's Home Health Physical Therapy prior to being released. Also, the skilled facility will be responsible for providing the patient with their medications at time of release from the facility to include their pain medication and their blood thinner medication. If the patient is still at the rehab facility at time of the two week follow up appointment, the skilled rehab facility will also need to assist the patient in arranging follow up appointment in our office and any transportation needs. ° °MAKE SURE YOU:  °Understand these instructions.  °Will watch your condition.  °Will get help right away if you are not doing well or get worse. ° °Pick up stool softner and  laxative for home use following surgery while on pain medications. °Do not remove your dressing. °The dressing is waterproof--it is OK to take showers. °Continue to use ice for pain and swelling after surgery. °Do not use any lotions or creams on the incision until instructed by your surgeon. °Total Hip Protocol. ° ° °

## 2015-08-01 NOTE — Anesthesia Preprocedure Evaluation (Signed)
Anesthesia Evaluation  Patient identified by MRN, date of birth, ID band Patient awake    Reviewed: Allergy & Precautions, NPO status , Patient's Chart, lab work & pertinent test results  Airway Mallampati: II  TM Distance: >3 FB Neck ROM: Full    Dental no notable dental hx.    Pulmonary neg pulmonary ROS,    Pulmonary exam normal breath sounds clear to auscultation       Cardiovascular negative cardio ROS Normal cardiovascular exam Rhythm:Regular Rate:Normal     Neuro/Psych negative neurological ROS  negative psych ROS   GI/Hepatic negative GI ROS, Neg liver ROS,   Endo/Other  negative endocrine ROS  Renal/GU negative Renal ROS     Musculoskeletal negative musculoskeletal ROS (+)   Abdominal   Peds  Hematology negative hematology ROS (+)   Anesthesia Other Findings   Reproductive/Obstetrics negative OB ROS                             Anesthesia Physical Anesthesia Plan  ASA: II  Anesthesia Plan: Spinal   Post-op Pain Management:    Induction:   Airway Management Planned:   Additional Equipment:   Intra-op Plan:   Post-operative Plan:   Informed Consent: I have reviewed the patients History and Physical, chart, labs and discussed the procedure including the risks, benefits and alternatives for the proposed anesthesia with the patient or authorized representative who has indicated his/her understanding and acceptance.   Dental advisory given  Plan Discussed with: CRNA  Anesthesia Plan Comments:         Anesthesia Quick Evaluation

## 2015-08-01 NOTE — H&P (View-Only) (Signed)
TOTAL HIP ADMISSION H&P  Patient is admitted for right total hip arthroplasty.  Subjective:  Chief Complaint: right hip pain  HPI: Karen Campos, 60 y.o. female, has a history of pain and functional disability in the right hip(s) due to arthritis and patient has failed non-surgical conservative treatments for greater than 12 weeks to include NSAID's and/or analgesics, flexibility and strengthening excercises, use of assistive devices and activity modification.  Onset of symptoms was abrupt starting 1 years ago with gradually worsening course since that time.The patient noted no past surgery on the right hip(s).  Patient currently rates pain in the right hip at 10 out of 10 with activity. Patient has night pain, worsening of pain with activity and weight bearing, pain that interfers with activities of daily living and pain with passive range of motion. Patient has evidence of subchondral cysts, subchondral sclerosis, periarticular osteophytes and joint space narrowing by imaging studies. This condition presents safety issues increasing the risk of falls. There is no current active infection.  There are no active problems to display for this patient.  Past Medical History  Diagnosis Date  . Perimenopausal   . Skin cancer     shoulder-2015    Past Surgical History  Procedure Laterality Date  . Vaginal delivery      x2     (Not in a hospital admission) Allergies  Allergen Reactions  . Penicillins Hives    Social History  Substance Use Topics  . Smoking status: Never Smoker   . Smokeless tobacco: Never Used  . Alcohol Use: No    Family History  Problem Relation Age of Onset  . Thyroid disease Mother   . Glaucoma Mother   . Heart disease Father   . Heart disease Paternal Aunt   . Breast cancer Paternal Grandmother      Review of Systems  Constitutional: Negative.   HENT: Negative.   Eyes: Negative.   Respiratory: Negative.   Cardiovascular: Negative.   Gastrointestinal:  Negative.   Genitourinary: Positive for frequency.  Musculoskeletal: Positive for joint pain.  Skin: Negative.   Neurological: Negative.   Endo/Heme/Allergies: Negative.   Psychiatric/Behavioral: Negative.     Objective:  Physical Exam  Vitals reviewed. Constitutional: She is oriented to person, place, and time. She appears well-developed and well-nourished.  HENT:  Head: Normocephalic and atraumatic.  Eyes: Conjunctivae and EOM are normal. Pupils are equal, round, and reactive to light.  Neck: Normal range of motion.  Cardiovascular: Normal rate, regular rhythm and normal heart sounds.   Respiratory: Effort normal and breath sounds normal. No respiratory distress.  GI: Soft. Bowel sounds are normal. She exhibits no distension.  Genitourinary:  deferred  Musculoskeletal:       Right hip: She exhibits decreased range of motion.  Neurological: She is alert and oriented to person, place, and time. She has normal reflexes.  Skin: Skin is warm and dry.  Psychiatric: She has a normal mood and affect. Her behavior is normal. Judgment and thought content normal.    Vital signs in last 24 hours: @VSRANGES @  Labs:   Estimated body mass index is 20.94 kg/(m^2) as calculated from the following:   Height as of 09/27/14: 5' 3.75" (1.619 m).   Weight as of 09/27/14: 54.885 kg (121 lb).   Imaging Review Plain radiographs demonstrate severe degenerative joint disease of the right hip(s). The bone quality appears to be adequate for age and reported activity level.  Assessment/Plan:  End stage arthritis, right hip(s)  The  patient history, physical examination, clinical judgement of the provider and imaging studies are consistent with end stage degenerative joint disease of the right hip(s) and total hip arthroplasty is deemed medically necessary. The treatment options including medical management, injection therapy, arthroscopy and arthroplasty were discussed at length. The risks and  benefits of total hip arthroplasty were presented and reviewed. The risks due to aseptic loosening, infection, stiffness, dislocation/subluxation,  thromboembolic complications and other imponderables were discussed.  The patient acknowledged the explanation, agreed to proceed with the plan and consent was signed. Patient is being admitted for inpatient treatment for surgery, pain control, PT, OT, prophylactic antibiotics, VTE prophylaxis, progressive ambulation and ADL's and discharge planning.The patient is planning to be discharged home with home health services

## 2015-08-01 NOTE — Op Note (Signed)
OPERATIVE REPORT  SURGEON: Rod Can, MD   ASSISTANT: Nehemiah Massed, PA-C. Roberto Scales, RNFA.  PREOPERATIVE DIAGNOSIS: Right hip arthritis.   POSTOPERATIVE DIAGNOSIS: Right hip arthritis.   PROCEDURE: Right total hip arthroplasty, anterior approach.   IMPLANTS: DePuy Tri Lock stem, size 2, hi offset. DePuy Pinnacle Cup, size 52 mm. DePuy Altrx liner, size 32 by 52 mm, +4 neutral. DePuy Biolox ceramic head ball, size 32 + 1 mm.  ANESTHESIA:  Spinal  ESTIMATED BLOOD LOSS: 300 mL.  ANTIBIOTICS: 900 mg clindamycin. 1 g vancomycin.  DRAINS: None.  COMPLICATIONS: None.   CONDITION: PACU - hemodynamically stable.  BRIEF CLINICAL NOTE: Karen Campos is a 60 y.o. female with a long-standing history of Right hip arthritis. After failing conservative management, the patient was indicated for total hip arthroplasty. The risks, benefits, and alternatives to the procedure were explained, and the patient elected to proceed.  PROCEDURE IN DETAIL: Surgical site was marked by myself. Spinal anesthesia was obtained in the pre-op holding area. Once inside the operative room, a foley catheter was inserted. The patient was then positioned on the Hana table. All bony prominences were well padded. The hip was prepped and draped in the normal sterile surgical fashion. A time-out was called verifying side and site of surgery. The patient received IV antibiotics within 60 minutes of beginning the procedure.  The direct anterior approach to the hip was performed through the Hueter interval. Lateral femoral circumflex vessels were treated with the Auqumantys. The anterior capsule was exposed and an inverted T capsulotomy was made.The femoral neck cut was made to the level of the templated cut. A corkscrew was placed into the head and the head was removed. The femoral head was found to have eburnated bone. The head was passed to the back table and was measured.  Acetabular  exposure was achieved, and the pulvinar and labrum were excised. Sequental reaming of the acetabulum was then performed up to a size 51 mm reamer. A 52 mm cup was then opened and impacted into place at approximately 40 degrees of abduction and 20 degrees of anteversion. The final polyethylene liner was impacted into place and acetabular osteophytes were removed.   I then gained femoral exposure taking care to protect the abductors and greater trochanter. This was performed using standard external rotation, extension, and adduction. The capsule was peeled off the inner aspect of the greater trochanter, taking care to preserve the short external rotators. A cookie cutter was used to enter the femoral canal, and then the femoral canal finder was placed. Sequential broaching was performed up to a size 2. Calcar planer was used on the femoral neck remnant. I placed a hi offset neck and a trial head ball. The hip was reduced. Leg lengths and offset were checked fluoroscopically. The hip was dislocated and trial components were removed. The final implants were placed, and the hip was reduced.  Fluoroscopy was used to confirm component position and leg lengths. At 90 degrees of external rotation and full extension, the hip was stable to an anterior directed force.  The wound was copiously irrigated with a dilute betadine solution followed by normal saline. Marcaine solution was injected into the periarticular soft tissue. The wound was closed in layers using #1 Vicryl and V-Loc for the fascia, 2-0 Vicryl for the subcutaneous fat, 2-0 Monocryl for the deep dermal layer, 3-0 running Monocryl subcuticular stitch, and Dermabond for the skin. Once the glue was fully dried, an Aquacell Ag dressing was applied. The  patient was transported to the recovery room in stable condition. Sponge, needle, and instrument counts were correct at the end of the case x2. The patient tolerated the procedure well and there  were no known complications.  Please note that a surgical assistant was a medical necessity for this procedure to perform it in a safe and expeditious manner. Assistant was necessary to provide appropriate retraction of vital neurovascular structures, to prevent femoral fracture, and to allow for anatomic placement of the prosthesis.

## 2015-08-01 NOTE — Transfer of Care (Signed)
Immediate Anesthesia Transfer of Care Note  Patient: Karen Campos [facility]  Procedure(s) Performed: Procedure(s): RIGHT TOTAL HIP ARTHROPLASTY ANTERIOR APPROACH (Right)  Patient Location: PACU  Anesthesia Type:General  Level of Consciousness:  sedated, patient cooperative and responds to stimulation  Airway & Oxygen Therapy:Patient Spontanous Breathing and Patient connected to face mask oxgen  Post-op Assessment:  Report given to PACU RN and Post -op Vital signs reviewed and stable  Post vital signs:  Reviewed and stable  Last Vitals:  Filed Vitals:   08/01/15 1452  BP: 135/86  Pulse: 86  Temp: 36.9 C  Resp: 16    Complications: No apparent anesthesia complications

## 2015-08-02 ENCOUNTER — Encounter (HOSPITAL_COMMUNITY): Payer: Self-pay | Admitting: Orthopedic Surgery

## 2015-08-02 LAB — BASIC METABOLIC PANEL
Anion gap: 8 (ref 5–15)
BUN: 14 mg/dL (ref 6–20)
CALCIUM: 8.2 mg/dL — AB (ref 8.9–10.3)
CO2: 22 mmol/L (ref 22–32)
Chloride: 110 mmol/L (ref 101–111)
Creatinine, Ser: 0.61 mg/dL (ref 0.44–1.00)
GFR calc Af Amer: >60 mL/min (ref >60)
GLUCOSE: 176 mg/dL — AB (ref 65–99)
Potassium: 4.1 mmol/L (ref 3.5–5.1)
Sodium: 140 mmol/L (ref 135–145)

## 2015-08-02 LAB — CBC
HCT: 27.3 % — ABNORMAL LOW (ref 36.0–46.0)
Hemoglobin: 9.6 g/dL — ABNORMAL LOW (ref 12.0–15.0)
MCH: 31.2 pg (ref 26.0–34.0)
MCHC: 35.2 g/dL (ref 30.0–36.0)
MCV: 88.6 fL (ref 78.0–100.0)
PLATELETS: 148 10*3/uL — AB (ref 150–400)
RBC: 3.08 MIL/uL — ABNORMAL LOW (ref 3.87–5.11)
RDW: 12.4 % (ref 11.5–15.5)
WBC: 9.5 10*3/uL (ref 4.0–10.5)

## 2015-08-02 MED ORDER — SENNA 8.6 MG PO TABS: 2.0000 | ORAL_TABLET | Freq: Every day | ORAL | Status: DC

## 2015-08-02 MED ORDER — HYDROCODONE-ACETAMINOPHEN 5-325 MG PO TABS: 1.0000 | ORAL_TABLET | ORAL | Status: DC | PRN

## 2015-08-02 MED ORDER — DOCUSATE SODIUM 100 MG PO CAPS: 100.0000 mg | ORAL_CAPSULE | Freq: Two times a day (BID) | ORAL | Status: DC

## 2015-08-02 MED ORDER — METHOCARBAMOL 500 MG PO TABS: 500.0000 mg | ORAL_TABLET | Freq: Four times a day (QID) | ORAL | Status: DC | PRN

## 2015-08-02 MED ORDER — ASPIRIN 81 MG PO TBEC: 81.0000 mg | DELAYED_RELEASE_TABLET | Freq: Two times a day (BID) | ORAL | Status: DC

## 2015-08-02 MED ORDER — ONDANSETRON HCL 4 MG PO TABS: 4.0000 mg | ORAL_TABLET | Freq: Four times a day (QID) | ORAL | Status: DC | PRN

## 2015-08-02 NOTE — Care Management Note (Signed)
Case Management Note  Patient Details  Name: Karen Campos MRN: [id number] Date of Birth: 08/09/1955  Subjective/Objective:       Right total hip arthroplasty             Action/Plan: Discharge Planning: AVS reviewed: NCM spoke to pt and offered choice for HH/provided [facility] list. Pt agreeable to [place] for [facility]. Requesting RW and 3n1 for home. Contacted [facility] DME rep for delivered to room prior to dc today. Can afford medications.   PCPDonald Prose MD  Expected Discharge Date:  08/02/2015             Expected Discharge Plan:  [place]  In-House Referral:  NA  Discharge planning Services  CM Consult  Post Acute Care Choice:  NA, Home Health Choice offered to:  Patient  DME Arranged:  3-N-1, Walker rolling DME Agency:  [place]:  PT [place]:  [place]  Status of Service:  Completed, signed off  Medicare Important Message Given:    Date Medicare IM Given:    Medicare IM give by:    Date Additional Medicare IM Given:    Additional Medicare Important Message give by:     If discussed at [place] of Stay Meetings, dates discussed:    Additional Comments:  Erenest Rasher, RN 08/02/2015, 11:44 AM

## 2015-08-02 NOTE — Progress Notes (Signed)
[facility]    [facility] is providing the following services:RW and [employer]  If patient discharges after hours, please call [phone].   Linward Headland 08/02/2015, 11:54 AM

## 2015-08-02 NOTE — Progress Notes (Signed)
Utilization review complete. Alroy Portela RN CCM Case Mgmt phone [phone] 

## 2015-08-02 NOTE — Progress Notes (Signed)
   Subjective:  Patient reports pain as mild to moderate.  Denies N/V/CP/SOB.  Objective:   VITALS:   Filed Vitals:   08/02/15 0000 08/02/15 0100 08/02/15 0212 08/02/15 0635  BP: 99/57 95/53 107/52 92/46  Pulse: 48 61 79 66  Temp: 98 F (36.7 C) 98.5 F (36.9 C) 98.9 F (37.2 C) 99.1 F (37.3 C)  TempSrc: Oral Oral Oral Oral  Resp: 15 15 15 15   Height:      Weight:      SpO2: 100% 100% 100% 98%    ABD soft Sensation intact distally Intact pulses distally Dorsiflexion/Plantar flexion intact Incision: dressing C/D/I Compartment soft   Lab Results  Component Value Date   WBC 9.5 08/02/2015   HGB 9.6* 08/02/2015   HCT 27.3* 08/02/2015   MCV 88.6 08/02/2015   PLT 148* 08/02/2015   BMET    Component Value Date/Time   NA 140 08/02/2015 0408   K 4.1 08/02/2015 0408   CL 110 08/02/2015 0408   CO2 22 08/02/2015 0408   GLUCOSE 176* 08/02/2015 0408   BUN 14 08/02/2015 0408   CREATININE 0.61 08/02/2015 0408   CREATININE 0.65 09/27/2014 0954   CALCIUM 8.2* 08/02/2015 0408   GFRNONAA >60 08/02/2015 0408   GFRAA >60 08/02/2015 0408     Assessment/Plan: 1 Day Post-Op   Active Problems:   Primary osteoarthritis of right hip    WBAT with walker DVT ppx: ASA, SCDs, TEDs PO pain control PT/OT D/C home with HHPT    Sher Shampine, Horald Pollen 08/02/2015, 7:48 AM   Rod Can, MD Cell [phone]

## 2015-08-02 NOTE — Evaluation (Signed)
Physical Therapy Evaluation Patient Details Name: Karen Campos MRN: [id number] DOB: [birth year] Today's Date: 08/02/2015   History of Present Illness  60 yo F s/p Right total hip arthroplasty, anterior approach.  Clinical Impression  Pt progressing well with mobility and eager for dc home.  Spouse present and reviewed stairs and car transfers.    Follow Up Recommendations Home health PT    Equipment Recommendations  Rolling walker with 5" wheels    Recommendations for Other Services OT consult     Precautions / Restrictions Precautions Precautions: Fall Restrictions Weight Bearing Restrictions: No Other Position/Activity Restrictions: WBAT      Mobility  Bed Mobility Overal bed mobility: Needs Assistance Bed Mobility: Supine to Sit     Supine to sit: Min assist     General bed mobility comments: cues for sequence and use of L LE to self assist  Transfers Overall transfer level: Needs assistance Equipment used: None Transfers: Sit to/from Stand Sit to Stand: Min guard;Supervision         General transfer comment: cues for safe transition position and LE management  Ambulation/Gait Ambulation/Gait assistance: Min guard;Supervision Ambulation Distance (Feet): 140 Feet Assistive device: Rolling walker (2 wheeled) Gait Pattern/deviations: Step-to pattern;Step-through pattern;Decreased step length - right;Decreased step length - left;Shuffle;Trunk flexed Gait velocity: decr Gait velocity interpretation: Below normal speed for age/gender General Gait Details: cues for sequence, posture and position from RW  Stairs Stairs: Yes Stairs assistance: Min assist Stair Management: No rails;Step to pattern;Backwards;With walker Number of Stairs: 4 General stair comments: 2 steps twice bkwd with RW and cues for sequence and foot/RW placement.  Spouse assisting on second attempt  Wheelchair Mobility    Modified Rankin (Stroke Patients Only)       Balance                                              Pertinent Vitals/Pain Pain Assessment: No/denies pain    Home Living Family/patient expects to be discharged to:: Private residence Living Arrangements: Spouse/significant other Available Help at Discharge: Family Type of Home: House Home Access: Stairs to enter Entrance Stairs-Rails: None Entrance Stairs-Number of Steps: 2 Home Layout: Two level;Able to live on main level with bedroom/bathroom Home Equipment: Shower seat - built in      Prior Function Level of Independence: Independent               Hand Dominance   Dominant Hand: Right    Extremity/Trunk Assessment   Upper Extremity Assessment: Overall WFL for tasks assessed           Lower Extremity Assessment: RLE deficits/detail RLE Deficits / Details: Strength at hip 2+/5 with AAROM at hip to 90 flex and 15 abd    Cervical / Trunk Assessment: Normal  Communication   Communication: No difficulties  Cognition Arousal/Alertness: Awake/alert Behavior During Therapy: WFL for tasks assessed/performed Overall Cognitive Status: Within Functional Limits for tasks assessed                      General Comments      Exercises Total Joint Exercises Ankle Circles/Pumps: AROM;Both;15 reps;Supine Quad Sets: AROM;Both;10 reps;Supine Heel Slides: AAROM;Right;20 reps;Supine Hip ABduction/ADduction: AAROM;Right;15 reps;Supine      Assessment/Plan    PT Assessment Patient needs continued PT services  PT Diagnosis Difficulty walking   PT Problem List Decreased strength;Decreased  range of motion;Decreased activity tolerance;Decreased mobility;Decreased knowledge of use of DME;Pain  PT Treatment Interventions DME instruction;Gait training;Stair training;Functional mobility training;Therapeutic activities;Therapeutic exercise;Patient/family education   PT Goals (Current goals can be found in the Care Plan section) Acute Rehab PT Goals Patient  Stated Goal: home today PT Goal Formulation: With patient Time For Goal Achievement: 08/03/15 Potential to Achieve Goals: Good    Frequency 7X/week   Barriers to discharge        Co-evaluation               End of Session Equipment Utilized During Treatment: Gait belt Activity Tolerance: Patient tolerated treatment well Patient left: in chair;with call bell/phone within reach;with family/visitor present Nurse Communication: Mobility status         Time: [id number] PT Time Calculation (min) (ACUTE ONLY): 17 min   Charges:   PT Evaluation $PT Eval Low Complexity: 1 Procedure PT Treatments $Gait Training: 8-22 mins $Therapeutic Exercise: 8-22 mins   PT G Codes:        Naveen Clardy 08-15-15, 3:54 PM

## 2015-08-02 NOTE — Evaluation (Signed)
Physical Therapy Evaluation Patient Details Name: Karen Campos MRN: [id number] DOB: Aug 24, 1955 Today's Date: 08/02/2015   History of Present Illness  60 yo F s/p Right total hip arthroplasty, anterior approach.  Clinical Impression  Pt s/p R THR presents with decreased R LE strength/ROM and post op pain limiting functional mobility.  Pt should progress to dc home with family assist and HHPT follow up.    Follow Up Recommendations Home health PT    Equipment Recommendations  Rolling walker with 5" wheels    Recommendations for Other Services OT consult     Precautions / Restrictions Precautions Precautions: Fall Restrictions Weight Bearing Restrictions: No Other Position/Activity Restrictions: WBAT      Mobility  Bed Mobility Overal bed mobility: Needs Assistance Bed Mobility: Supine to Sit     Supine to sit: Min assist     General bed mobility comments: cues for sequence and use of L LE to self assist  Transfers Overall transfer level: Needs assistance Equipment used: Rolling walker (2 wheeled) Transfers: Sit to/from Stand Sit to Stand: Min guard         General transfer comment: cues for LE management and use of UEs to self assist  Ambulation/Gait Ambulation/Gait assistance: Min assist;Min guard Ambulation Distance (Feet): 68 Feet Assistive device: Rolling walker (2 wheeled) Gait Pattern/deviations: Step-to pattern;Decreased step length - right;Decreased step length - left;Shuffle;Trunk flexed Gait velocity: decr Gait velocity interpretation: Below normal speed for age/gender General Gait Details: cues for sequence, posture and position from [employer]            Wheelchair Mobility    Modified Rankin (Stroke Patients Only)       Balance                                             Pertinent Vitals/Pain Pain Assessment: No/denies pain    Home Living Family/patient expects to be discharged to:: Private  residence Living Arrangements: Spouse/significant other Available Help at Discharge: Family Type of Home: House Home Access: Stairs to enter Entrance Stairs-Rails: None Entrance Stairs-Number of Steps: 2 Home Layout: Two level;Able to live on main level with bedroom/bathroom Home Equipment: Shower seat - built in      Prior Function Level of Independence: Independent               Hand Dominance   Dominant Hand: Right    Extremity/Trunk Assessment   Upper Extremity Assessment: Overall WFL for tasks assessed           Lower Extremity Assessment: RLE deficits/detail RLE Deficits / Details: Strength at hip 2+/5 with AAROM at hip to 90 flex and 15 abd    Cervical / Trunk Assessment: Normal  Communication   Communication: No difficulties  Cognition Arousal/Alertness: Awake/alert Behavior During Therapy: WFL for tasks assessed/performed Overall Cognitive Status: Within Functional Limits for tasks assessed                      General Comments      Exercises Total Joint Exercises Ankle Circles/Pumps: AROM;Both;15 reps;Supine Quad Sets: AROM;Both;10 reps;Supine Heel Slides: AAROM;Right;20 reps;Supine Hip ABduction/ADduction: AAROM;Right;15 reps;Supine      Assessment/Plan    PT Assessment Patient needs continued PT services  PT Diagnosis Difficulty walking   PT Problem List Decreased strength;Decreased range of motion;Decreased activity tolerance;Decreased mobility;Decreased knowledge of use of DME;Pain  PT Treatment Interventions DME instruction;Gait training;Stair training;Functional mobility training;Therapeutic activities;Therapeutic exercise;Patient/family education   PT Goals (Current goals can be found in the Care Plan section) Acute Rehab PT Goals Patient Stated Goal: home today PT Goal Formulation: With patient Time For Goal Achievement: 08/03/15 Potential to Achieve Goals: Good    Frequency 7X/week   Barriers to discharge         Co-evaluation               End of Session Equipment Utilized During Treatment: Gait belt Activity Tolerance: Patient tolerated treatment well Patient left: in chair;with call bell/phone within reach;with family/visitor present Nurse Communication: Mobility status         Time: 0902-0932 PT Time Calculation (min) (ACUTE ONLY): 30 min   Charges:   PT Evaluation $PT Eval Low Complexity: 1 Procedure PT Treatments $Therapeutic Exercise: 8-22 mins   PT G Codes:        Iara Monds Aug 06, 2015, 3:48 PM

## 2015-08-02 NOTE — Progress Notes (Signed)
Occupational Therapy Evaluation Patient Details Name: Karen Campos MRN: [id number] DOB: 08-07-1955 Today's Date: 08/02/2015    History of Present Illness 60 yo F s/p Right total hip arthroplasty, anterior approach.   Clinical Impression   All education completed and pt question answered. No further OT needs; will sign off.    Follow Up Recommendations  No OT follow up;Supervision/Assistance - 24 hour    Equipment Recommendations  3 in 1 bedside comode    Recommendations for Other Services       Precautions / Restrictions Precautions Precautions: None Restrictions Weight Bearing Restrictions: No Other Position/Activity Restrictions: WBAT      Mobility Bed Mobility                  Transfers Overall transfer level: Needs assistance Equipment used: Rolling walker (2 wheeled) Transfers: Sit to/from Stand Sit to Stand: Supervision              Balance                                            ADL Overall ADL's : Needs assistance/impaired Eating/Feeding: Independent;Sitting   Grooming: Wash/dry hands;Supervision/safety;Standing   Upper Body Bathing: Set up;Sitting   Lower Body Bathing: Supervison/ safety;Sit to/from stand   Upper Body Dressing : Set up;Sitting   Lower Body Dressing: Supervision/safety;Sit to/from stand   Toilet Transfer: Supervision/safety;Ambulation;BSC;RW   Toileting- Clothing Manipulation and Hygiene: Supervision/safety;Sit to/from stand   Tub/ Shower Transfer: Walk-in shower;Supervision/safety;Ambulation;Shower seat;Rolling walker   Functional mobility during ADLs: Supervision/safety;Rolling walker       Vision     Perception     Praxis      Pertinent Vitals/Pain Pain Assessment: No/denies pain     Hand Dominance Right   Extremity/Trunk Assessment Upper Extremity Assessment Upper Extremity Assessment: Overall WFL for tasks assessed   Lower Extremity Assessment Lower Extremity  Assessment: Defer to PT evaluation   Cervical / Trunk Assessment Cervical / Trunk Assessment: Normal   Communication Communication Communication: No difficulties   Cognition Arousal/Alertness: Awake/alert Behavior During Therapy: WFL for tasks assessed/performed Overall Cognitive Status: Within Functional Limits for tasks assessed                     General Comments       Exercises       Shoulder Instructions      Home Living Family/patient expects to be discharged to:: Private residence Living Arrangements: Spouse/significant other Available Help at Discharge: Family Type of Home: House       Home Layout: Two level;Able to live on main level with bedroom/bathroom     Bathroom Shower/Tub: Occupational psychologist: Standard (chair height) Bathroom Accessibility: Yes   Home Equipment: Shower seat - built in          Prior Functioning/Environment Level of Independence: Independent             OT Diagnosis: Acute pain   OT Problem List: Decreased range of motion;Decreased activity tolerance;Decreased knowledge of use of DME or AE;Pain   OT Treatment/Interventions:      OT Goals(Current goals can be found in the care plan section) Acute Rehab OT Goals Patient Stated Goal: home today OT Goal Formulation: All assessment and education complete, DC therapy  OT Frequency:     Barriers to D/C:  Co-evaluation              End of Session Equipment Utilized During Treatment: Surveyor, mining Communication: Mobility status  Activity Tolerance: Patient tolerated treatment well Patient left: in chair;with call bell/phone within reach;with family/visitor present   Time: [id number] OT Time Calculation (min): 32 min Charges:  OT General Charges $OT Visit: 1 Procedure OT Evaluation $OT Eval Low Complexity: 1 Procedure OT Treatments $Self Care/Home Management : 8-22 mins G-Codes:    [place] Leap A 08/12/2015, 11:24  AM

## 2015-08-02 NOTE — Discharge Summary (Signed)
Physician Discharge Summary  Patient ID: Karen Campos MRN: [id number] DOB/AGE: [birth year] 60 y.o.  Admit date: 08/01/2015 Discharge date: 08/02/2015  Admission Diagnoses:  Primary osteoarthritis of right hip  Discharge Diagnoses:  Principal Problem:   Primary osteoarthritis of right hip   Past Medical History  Diagnosis Date  . Perimenopausal   . Skin cancer     shoulder-2015  . Wears glasses     Surgeries: Procedure(s): RIGHT TOTAL HIP ARTHROPLASTY ANTERIOR APPROACH on 08/01/2015   Consultants (if any):    Discharged Condition: Improved  Hospital Course: Karen Campos is an 60 y.o. female who was admitted 08/01/2015 with a diagnosis of Primary osteoarthritis of right hip and went to the operating room on 08/01/2015 and underwent the above named procedures.    She was given perioperative antibiotics:      Anti-infectives    Start     Dose/Rate Route Frequency Ordered Stop   08/02/15 0600  clindamycin (CLEOCIN) IVPB 900 mg     900 mg 100 mL/hr over 30 Minutes Intravenous On call to O.R. 08/01/15 1444 08/01/15 1751   08/02/15 0600  vancomycin (VANCOCIN) IVPB 1000 mg/200 mL premix     1,000 mg 200 mL/hr over 60 Minutes Intravenous On call to O.R. 08/01/15 1444 08/01/15 1739   08/02/15 0400  vancomycin (VANCOCIN) IVPB 1000 mg/200 mL premix     1,000 mg 200 mL/hr over 60 Minutes Intravenous Every 12 hours 08/01/15 2211 08/02/15 0503    .  She was given sequential compression devices, early ambulation, and ASA for DVT prophylaxis.  She benefited maximally from the hospital stay and there were no complications.    Recent vital signs:  Filed Vitals:   08/02/15 1030 08/02/15 1407  BP: 95/40 109/49  Pulse: 76 74  Temp: 98.8 F (37.1 C)   Resp: 14 15    Recent laboratory studies:  Lab Results  Component Value Date   HGB 9.6* 08/02/2015   HGB 14.0 07/29/2015   HGB 14.0 09/27/2014   Lab Results  Component Value Date   WBC 9.5 08/02/2015   PLT 148* 08/02/2015    Lab Results  Component Value Date   INR 0.97 07/29/2015   Lab Results  Component Value Date   NA 140 08/02/2015   K 4.1 08/02/2015   CL 110 08/02/2015   CO2 22 08/02/2015   BUN 14 08/02/2015   CREATININE 0.61 08/02/2015   GLUCOSE 176* 08/02/2015    Discharge Medications:     Medication List    STOP taking these medications        aspirin 81 MG tablet  Replaced by:  aspirin 81 MG EC tablet      TAKE these medications        aspirin 81 MG EC tablet  Take 1 tablet (81 mg total) by mouth 2 (two) times daily after a meal.     Calcium Carb-Cholecalciferol 600-800 MG-UNIT Tabs  Take 1 tablet by mouth daily.     docusate sodium 100 MG capsule  Commonly known as:  COLACE  Take 1 capsule (100 mg total) by mouth 2 (two) times daily.     ESTROVEN MENOPAUSE RELIEF Caps  Take 1 capsule by mouth daily.     GLUCOSAMINE 1500 COMPLEX Caps  Take by mouth.     HYDROcodone-acetaminophen 5-325 MG tablet  Commonly known as:  NORCO  Take 1-2 tablets by mouth every 4 (four) hours as needed for moderate pain.     ibuprofen 200  MG tablet  Commonly known as:  ADVIL,MOTRIN  Take 400 mg by mouth 2 (two) times daily.     Melatonin 3 MG Caps  Take 1 capsule by mouth at bedtime.     methocarbamol 500 MG tablet  Commonly known as:  ROBAXIN  Take 1 tablet (500 mg total) by mouth every 6 (six) hours as needed for muscle spasms.     multivitamin capsule  Take 1 capsule by mouth daily.     ondansetron 4 MG tablet  Commonly known as:  ZOFRAN  Take 1 tablet (4 mg total) by mouth every 6 (six) hours as needed for nausea.     senna 8.6 MG Tabs tablet  Commonly known as:  SENOKOT  Take 2 tablets (17.2 mg total) by mouth at bedtime.     ULTRA OMEGA-3 FISH OIL 1400 MG Caps  Take 1 capsule by mouth daily.        Diagnostic Studies: Dg Pelvis Portable  08/01/2015  CLINICAL DATA:  Right hip arthroplasty EXAM: PORTABLE PELVIS 1-2 VIEWS COMPARISON:  None. FINDINGS: A single postoperative  view obtained portably demonstrates right total hip arthroplasty. Components appear well aligned on this single view. No fracture or other immediate complication is evident. IMPRESSION: Right total hip arthroplasty Electronically Signed   By: Andreas Newport M.D.   On: 08/01/2015 20:04   Dg C-arm 1-60 Min-no Report  08/01/2015  CLINICAL DATA: surgery C-ARM 1-60 MINUTES Fluoroscopy was utilized by the requesting physician.  No radiographic interpretation.   Dg Hip Operative Unilat With Pelvis Right  08/01/2015  CLINICAL DATA:  Status post right hip arthroplasty. EXAM: OPERATIVE RIGHT HIP (WITH PELVIS IF PERFORMED) 2 VIEWS TECHNIQUE: Fluoroscopic spot image(s) were submitted for interpretation post-operatively. COMPARISON:  03/28/2015 FINDINGS: New right hip total arthroplasty appears well seated and aligned. There is no acute fracture or evidence of an operative complication. IMPRESSION: Well aligned right hip total arthroplasty. Electronically Signed   By: Lajean Manes M.D.   On: 08/01/2015 19:36    Disposition: 01-Home or Self Care  Discharge Instructions    Call MD / Call 911    Complete by:  As directed   If you experience chest pain or shortness of breath, CALL 911 and be transported to the hospital emergency room.  If you develope a fever above 101 F, pus (white drainage) or increased drainage or redness at the wound, or calf pain, call your surgeon's office.     Constipation Prevention    Complete by:  As directed   Drink plenty of fluids.  Prune juice may be helpful.  You may use a stool softener, such as Colace (over the counter) 100 mg twice a day.  Use MiraLax (over the counter) for constipation as needed.     Diet - low sodium heart healthy    Complete by:  As directed      Driving restrictions    Complete by:  As directed   No driving for 6 weeks     Increase activity slowly as tolerated    Complete by:  As directed      Lifting restrictions    Complete by:  As directed   No  lifting for 6 weeks     TED hose    Complete by:  As directed   Use stockings (TED hose) for 2 weeks on both leg(s).  You may remove them at night for sleeping.           Follow-up Information  Follow up with Elaria Osias, Horald Pollen, MD. Schedule an appointment as soon as possible for a visit in 2 weeks.   Specialty:  Orthopedic Surgery   Why:  For wound re-check   Contact information:   [address] [place] [address] [phone]       Follow up with [facility].   Why:  Home Health Physical Therapy   Contact information:   [address] [place] [address] [phone]        Signed: Elie Goody 08/02/2015, 5:27 PM

## 2015-09-30 ENCOUNTER — Encounter: Payer: Self-pay | Admitting: Nurse Practitioner

## 2015-09-30 ENCOUNTER — Ambulatory Visit (INDEPENDENT_AMBULATORY_CARE_PROVIDER_SITE_OTHER): Payer: BC Managed Care – PPO | Admitting: Nurse Practitioner

## 2015-09-30 VITALS — BP 120/76 | HR 82 | Resp 16 | Ht 63.5 in | Wt 124.0 lb

## 2015-09-30 DIAGNOSIS — Z01419 Encounter for gynecological examination (general) (routine) without abnormal findings: Secondary | ICD-10-CM

## 2015-09-30 DIAGNOSIS — Z0000 Encounter for general adult medical examination without abnormal findings: Secondary | ICD-10-CM | POA: Diagnosis not present

## 2015-09-30 DIAGNOSIS — Z1211 Encounter for screening for malignant neoplasm of colon: Secondary | ICD-10-CM

## 2015-09-30 DIAGNOSIS — R319 Hematuria, unspecified: Secondary | ICD-10-CM | POA: Diagnosis not present

## 2015-09-30 LAB — COMPREHENSIVE METABOLIC PANEL
ALK PHOS: 64 U/L (ref 33–130)
ALT: 13 U/L (ref 6–29)
AST: 19 U/L (ref 10–35)
Albumin: 5 g/dL (ref 3.6–5.1)
BILIRUBIN TOTAL: 0.4 mg/dL (ref 0.2–1.2)
BUN: 21 mg/dL (ref 7–25)
CO2: 25 mmol/L (ref 20–31)
CREATININE: 0.55 mg/dL (ref 0.50–0.99)
Calcium: 10.1 mg/dL (ref 8.6–10.4)
Chloride: 104 mmol/L (ref 98–110)
GLUCOSE: 85 mg/dL (ref 65–99)
Potassium: 4.4 mmol/L (ref 3.5–5.3)
Sodium: 141 mmol/L (ref 135–146)
Total Protein: 7.1 g/dL (ref 6.1–8.1)

## 2015-09-30 LAB — URINALYSIS, MICROSCOPIC ONLY
Bacteria, UA: NONE SEEN [HPF]
CASTS: NONE SEEN [LPF]
Squamous Epithelial / LPF: NONE SEEN [HPF] (ref <=5)
WBC, UA: NONE SEEN WBC/HPF (ref <=5)
YEAST: NONE SEEN [HPF]

## 2015-09-30 LAB — POCT URINALYSIS DIPSTICK
Bilirubin, UA: NEGATIVE
Glucose, UA: NEGATIVE
Ketones, UA: NEGATIVE
Leukocytes, UA: NEGATIVE
Nitrite, UA: NEGATIVE
PROTEIN UA: NEGATIVE
UROBILINOGEN UA: NEGATIVE
pH, UA: 5

## 2015-09-30 LAB — LIPID PANEL
Cholesterol: 205 mg/dL — ABNORMAL HIGH (ref 125–200)
HDL: 99 mg/dL (ref >=46)
LDL Cholesterol: 93 mg/dL (ref <130)
Total CHOL/HDL Ratio: 2.1 Ratio (ref <=5.0)
Triglycerides: 65 mg/dL (ref <150)
VLDL: 13 mg/dL (ref <30)

## 2015-09-30 LAB — HEMOGLOBIN, FINGERSTICK: HEMOGLOBIN, FINGERSTICK: 13.8 g/dL (ref 12.0–16.0)

## 2015-09-30 LAB — TSH: TSH: 3.01 m[IU]/L

## 2015-09-30 NOTE — Progress Notes (Signed)
Mammogram scheduled with [facility] for today at 1145 am. Patient agreeable to date and time.

## 2015-09-30 NOTE — Progress Notes (Signed)
60 y.o. G2P2 Married  Caucasian Fe here for annual exam.  Right hip replacement on 08/01/2015.  Doing well except for right knee pain. Now exercising on stationary bike.  Using a cane for ambulation in most cases.  Some vaso symptoms but tolerable.  Using vaginal lubrication prn.  Patient's last menstrual period was 04/30/2008.          Sexually active: Yes.    The current method of family planning is post menopausal status.    Exercising: No.  The patient does not participate in regular exercise at present. Smoker:  no  Health Maintenance: Pap:  09/13/12 Neg. HR HPV:neg MMG:  04/18/12 BIRADS1:neg Colonoscopy:  2012 normal - f/u 10 years  BMD:   04/01/10 -0.3/ -2.1/-1.3 TDaP:  11/04/2009 Shingles: No - will check at pharmacy if covered Hep C and HIV: done today Labs: Here UA: RBC=Trace Hg: 13.8   reports that she has never smoked. She has never used smokeless tobacco. She reports that she does not drink alcohol or use illicit drugs.  Past Medical History  Diagnosis Date  . Perimenopausal   . Skin cancer     shoulder-2015  . Wears glasses     Past Surgical History  Procedure Laterality Date  . Vaginal delivery      x2  . Colonscopy     . Total hip arthroplasty Right 08/01/2015    Procedure: RIGHT TOTAL HIP ARTHROPLASTY ANTERIOR APPROACH;  Surgeon: Rod Can, MD;  Location: [facility];  Service: Orthopedics;  Laterality: Right;    Current Outpatient Prescriptions  Medication Sig Dispense Refill  . aspirin 81 MG tablet Take 81 mg by mouth every other day.    . Black Cohosh-SoyIsoflav-Magnol (ESTROVEN MENOPAUSE RELIEF) CAPS Take 1 capsule by mouth daily.     . Calcium Carb-Cholecalciferol 600-800 MG-UNIT TABS Take 1 tablet by mouth daily.    . DUEXIS 800-26.6 MG TABS Take 1 tablet by mouth at bedtime as needed.  0  . Glucosamine-Chondroit-Vit C-Mn (GLUCOSAMINE 1500 COMPLEX) CAPS Take by mouth.    Marland Kitchen ibuprofen (ADVIL,MOTRIN) 800 MG tablet Take 800 mg by mouth at bedtime as needed.    .  Melatonin 3 MG CAPS Take 1 capsule by mouth at bedtime.    . Multiple Vitamin (MULTIVITAMIN) capsule Take 1 capsule by mouth daily.    . Omega-3 Fatty Acids (ULTRA OMEGA-3 FISH OIL) 1400 MG CAPS Take 1 capsule by mouth daily.     No current facility-administered medications for this visit.    Family History  Problem Relation Age of Onset  . Thyroid disease Mother   . Glaucoma Mother   . Heart disease Father   . Heart disease Paternal Aunt   . Breast cancer Paternal Grandmother     ROS:  Pertinent items are noted in HPI.  Otherwise, a comprehensive ROS was negative.  Exam:   BP 120/76 mmHg  Pulse 82  Resp 16  Ht 5' 3.5" (1.613 m)  Wt 124 lb (56.246 kg)  BMI 21.62 kg/m2  LMP 04/30/2008 Height: 5' 3.5" (161.3 cm) Ht Readings from Last 3 Encounters:  09/30/15 5' 3.5" (1.613 m)  08/01/15 5\' 4"  (1.626 m)  07/29/15 5\' 4"  (1.626 m)    General appearance: alert, cooperative and appears stated age Head: Normocephalic, without obvious abnormality, atraumatic Neck: no adenopathy, supple, symmetrical, trachea midline and thyroid normal to inspection and palpation Lungs: clear to auscultation bilaterally Breasts: normal appearance, no masses or tenderness Heart: regular rate and rhythm Abdomen: soft, non-tender;  no masses,  no organomegaly Extremities: extremities normal, atraumatic, no cyanosis or edema.  Healing wound right anterior thigh without redness, drainage. Skin: Skin color, texture, turgor normal. No rashes or lesions Lymph nodes: Cervical, supraclavicular, and axillary nodes normal. No abnormal inguinal nodes palpated Neurologic: Grossly normal   Pelvic: External genitalia:  no lesions              Urethra:  normal appearing urethra with no masses, tenderness or lesions              Bartholin's and Skene's: normal                 Vagina: normal appearing vagina with normal color and discharge, no lesions              Cervix: anteverted              Pap taken: Yes.    Bimanual Exam:  Uterus:  normal size, contour, position, consistency, mobility, non-tender              Adnexa: no mass, fullness, tenderness               Rectovaginal: Confirms               Anus:  normal sphincter tone, no lesions  Chaperone present: yes  A:  Well Woman with normal exam  Postmenopausal - vaso symptoms are improved on [place] Osteopenia hip  S/P right hip replacement 08/01/15   P:   Reviewed health and wellness pertinent to exam  Pap smear as above  Mammogram is past due and will schedule for her  IFOB is given  Follow with labs  Encouragement is given  Counseled on breast self exam, mammography screening, adequate intake of calcium and vitamin D, diet and exercise, Kegel's exercises return annually or prn  An After Visit Summary was printed and given to the patient.

## 2015-09-30 NOTE — Patient Instructions (Addendum)

## 2015-10-01 LAB — VITAMIN D 25 HYDROXY (VIT D DEFICIENCY, FRACTURES): Vit D, 25-Hydroxy: 51 ng/mL (ref 30–100)

## 2015-10-01 LAB — URINE CULTURE
Colony Count: NO GROWTH
Organism ID, Bacteria: NO GROWTH

## 2015-10-04 LAB — IPS PAP TEST WITH HPV

## 2015-10-06 NOTE — Progress Notes (Signed)
Encounter reviewed by Dr. Zakarie Sturdivant Amundson C. Silva.  

## 2015-10-07 ENCOUNTER — Encounter: Payer: Self-pay | Admitting: Nurse Practitioner

## 2015-11-14 LAB — FECAL OCCULT BLOOD, IMMUNOCHEMICAL: IMMUNOLOGICAL FECAL OCCULT BLOOD TEST: NEGATIVE

## 2015-11-14 NOTE — Addendum Note (Signed)
Addended by: Zoila Shutter D on: 11/14/2015 09:58 AM   Modules accepted: Orders

## 2016-09-18 ENCOUNTER — Telehealth: Payer: Self-pay | Admitting: Nurse Practitioner

## 2016-09-18 NOTE — Telephone Encounter (Signed)
Left message to call and reschedule cancelled appointment.

## 2016-10-02 ENCOUNTER — Ambulatory Visit: Payer: BC Managed Care – PPO | Admitting: Nurse Practitioner

## 2016-10-20 ENCOUNTER — Ambulatory Visit (INDEPENDENT_AMBULATORY_CARE_PROVIDER_SITE_OTHER): Payer: BC Managed Care – PPO | Admitting: Certified Nurse Midwife

## 2016-10-20 ENCOUNTER — Encounter: Payer: Self-pay | Admitting: Certified Nurse Midwife

## 2016-10-20 ENCOUNTER — Ambulatory Visit: Payer: BC Managed Care – PPO | Admitting: Nurse Practitioner

## 2016-10-20 VITALS — BP 112/64 | HR 68 | Resp 16 | Ht 63.75 in | Wt 126.0 lb

## 2016-10-20 DIAGNOSIS — N951 Menopausal and female climacteric states: Secondary | ICD-10-CM | POA: Diagnosis not present

## 2016-10-20 DIAGNOSIS — N898 Other specified noninflammatory disorders of vagina: Secondary | ICD-10-CM | POA: Diagnosis not present

## 2016-10-20 DIAGNOSIS — Z0000 Encounter for general adult medical examination without abnormal findings: Secondary | ICD-10-CM | POA: Diagnosis not present

## 2016-10-20 DIAGNOSIS — Z01419 Encounter for gynecological examination (general) (routine) without abnormal findings: Secondary | ICD-10-CM | POA: Diagnosis not present

## 2016-10-20 NOTE — Patient Instructions (Signed)

## 2016-10-20 NOTE — Progress Notes (Signed)
61 y.o. G2P2 Married  Caucasian Fe here for annual exam. Menopausal on OTC Estroven. Hip replacement in right hip, earlier this year, no issues with surgery. Sees PCP prn. Labs done here as needed. Some vaginal dryness, uses lubricant occasional. No issues. No other health issues today. Having mammogram today.  Patient's last menstrual period was 04/30/2008.          Sexually active: Yes.    The current method of family planning is post menopausal status.    Exercising: No.  exercise Smoker:  no  Health Maintenance: Pap:  09-13-12 neg HPV HR neg, 09-30-15 neg HPV HR neg History of Abnormal Pap: no MMG:  09-30-15 category c density 1:neg, Mammogram scheduled for today. Self Breast exams: no Colonoscopy:  2012 neg f/u 4yrs BMD:   2012 TDaP:  2011 Shingles: no Pneumonia: no Hep C and HIV: not done Labs: none   reports that she has never smoked. She has never used smokeless tobacco. She reports that she does not drink alcohol or use drugs.  Past Medical History:  Diagnosis Date  . Perimenopausal   . Skin cancer    shoulder-2015  . Wears glasses     Past Surgical History:  Procedure Laterality Date  . colonscopy     . TOTAL HIP ARTHROPLASTY Right 08/01/2015   Procedure: RIGHT TOTAL HIP ARTHROPLASTY ANTERIOR APPROACH;  Surgeon: Rod Can, MD;  Location: [facility];  Service: Orthopedics;  Laterality: Right;  Marland Kitchen VAGINAL DELIVERY     x2    Current Outpatient Prescriptions  Medication Sig Dispense Refill  . aspirin 81 MG tablet Take 81 mg by mouth every other day.    . Black Cohosh-SoyIsoflav-Magnol (ESTROVEN MENOPAUSE RELIEF) CAPS Take 1 capsule by mouth daily.     . Calcium Carb-Cholecalciferol 600-800 MG-UNIT TABS Take 1 tablet by mouth daily.    . Glucosamine-Chondroit-Vit C-Mn (GLUCOSAMINE 1500 COMPLEX) CAPS Take by mouth.    . Melatonin 3 MG CAPS Take 1 capsule by mouth at bedtime.    . Multiple Vitamin (MULTIVITAMIN) capsule Take 1 capsule by mouth daily.    . Omega-3 Fatty  Acids (ULTRA OMEGA-3 FISH OIL) 1400 MG CAPS Take 1 capsule by mouth daily.     No current facility-administered medications for this visit.     Family History  Problem Relation Age of Onset  . Thyroid disease Mother   . Glaucoma Mother   . Heart disease Father   . Heart disease Paternal Aunt   . Breast cancer Paternal Grandmother     ROS:  Pertinent items are noted in HPI.  Otherwise, a comprehensive ROS was negative.  Exam:   BP 112/64   Pulse 68   Resp 16   Ht 5' 3.75" (1.619 m)   Wt 126 lb (57.2 kg)   LMP 04/30/2008   BMI 21.80 kg/m  Height: 5' 3.75" (161.9 cm) Ht Readings from Last 3 Encounters:  10/20/16 5' 3.75" (1.619 m)  09/30/15 5' 3.5" (1.613 m)  08/01/15 5\' 4"  (1.626 m)    General appearance: alert, cooperative and appears stated age Head: Normocephalic, without obvious abnormality, atraumatic Neck: no adenopathy, supple, symmetrical, trachea midline and thyroid normal to inspection and palpation Lungs: clear to auscultation bilaterally Breasts: normal appearance, no masses or tenderness, No nipple retraction or dimpling, No nipple discharge or bleeding, No axillary or supraclavicular adenopathy Heart: regular rate and rhythm Abdomen: soft, non-tender; no masses,  no organomegaly Extremities: extremities normal, atraumatic, no cyanosis or edema Skin: Skin color, texture,  turgor normal. No rashes or lesions Lymph nodes: Cervical, supraclavicular, and axillary nodes normal. No abnormal inguinal nodes palpated Neurologic: Grossly normal   Pelvic: External genitalia:  no lesions              Urethra:  normal appearing urethra with no masses, tenderness or lesions              Bartholin's and Skene's: normal                 Vagina: normal appearing vagina with normal color and discharge, no lesions              Cervix: no cervical motion tenderness, no lesions and normal appearance              Pap taken: No. 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 Questions addressed  Keep mammogram appointment today  Labs: CMP,Hep C, HIV, Lipid panel, TSH, Vitamin D  Pap smear: no   counseled on breast self exam, mammography screening, adequate intake of calcium and vitamin D, diet and exercise, Kegel's exercises  return annually or prn  An After Visit Summary was printed and given to the patient.

## 2016-10-21 ENCOUNTER — Other Ambulatory Visit: Payer: Self-pay | Admitting: Certified Nurse Midwife

## 2016-10-21 DIAGNOSIS — R899 Unspecified abnormal finding in specimens from other organs, systems and tissues: Secondary | ICD-10-CM

## 2016-10-21 LAB — COMPREHENSIVE METABOLIC PANEL
ALBUMIN: 4.9 g/dL — AB (ref 3.6–4.8)
ALK PHOS: 66 IU/L (ref 39–117)
ALT: 14 IU/L (ref 0–32)
AST: 24 IU/L (ref 0–40)
Albumin/Globulin Ratio: 2.6 — ABNORMAL HIGH (ref 1.2–2.2)
BILIRUBIN TOTAL: 0.5 mg/dL (ref 0.0–1.2)
BUN / CREAT RATIO: 21 (ref 12–28)
BUN: 13 mg/dL (ref 8–27)
CHLORIDE: 105 mmol/L (ref 96–106)
CO2: 20 mmol/L (ref 20–29)
CREATININE: 0.62 mg/dL (ref 0.57–1.00)
Calcium: 9.7 mg/dL (ref 8.7–10.3)
GFR calc non Af Amer: 98 mL/min/{1.73_m2} (ref >59)
GFR, EST AFRICAN AMERICAN: 113 mL/min/{1.73_m2} (ref >59)
GLOBULIN, TOTAL: 1.9 g/dL (ref 1.5–4.5)
Glucose: 94 mg/dL (ref 65–99)
Potassium: 4.4 mmol/L (ref 3.5–5.2)
SODIUM: 141 mmol/L (ref 134–144)
TOTAL PROTEIN: 6.8 g/dL (ref 6.0–8.5)

## 2016-10-21 LAB — LIPID PANEL
CHOL/HDL RATIO: 2.2 ratio (ref 0.0–4.4)
CHOLESTEROL TOTAL: 185 mg/dL (ref 100–199)
HDL: 86 mg/dL (ref >39)
LDL CALC: 82 mg/dL (ref 0–99)
TRIGLYCERIDES: 83 mg/dL (ref 0–149)
VLDL Cholesterol Cal: 17 mg/dL (ref 5–40)

## 2016-10-21 LAB — HIV ANTIBODY (ROUTINE TESTING W REFLEX): HIV SCREEN 4TH GENERATION: NONREACTIVE

## 2016-10-21 LAB — TSH: TSH: 3.93 u[IU]/mL (ref 0.450–4.500)

## 2016-10-21 LAB — VITAMIN D 25 HYDROXY (VIT D DEFICIENCY, FRACTURES): Vit D, 25-Hydroxy: 49.4 ng/mL (ref 30.0–100.0)

## 2016-10-21 LAB — HEPATITIS C ANTIBODY: Hep C Virus Ab: <0.1 s/co ratio (ref 0.0–0.9)

## 2016-11-04 ENCOUNTER — Other Ambulatory Visit (INDEPENDENT_AMBULATORY_CARE_PROVIDER_SITE_OTHER): Payer: BC Managed Care – PPO

## 2016-11-04 DIAGNOSIS — R899 Unspecified abnormal finding in specimens from other organs, systems and tissues: Secondary | ICD-10-CM

## 2016-11-05 LAB — ALBUMIN: ALBUMIN: 5 g/dL — AB (ref 3.6–4.8)

## 2016-11-07 ENCOUNTER — Encounter: Payer: Self-pay | Admitting: Obstetrics and Gynecology

## 2016-11-09 ENCOUNTER — Telehealth: Payer: Self-pay | Admitting: *Deleted

## 2016-11-09 NOTE — Telephone Encounter (Signed)
Notes recorded by [name], RN on 11/09/2016 at 10:55 AM EDT Left message to call [name] at [phone].  ------  Notes recorded by [name], MD on 11/07/2016 at 7:15 PM EDT This is Dr. [name] reviewing Karen Campos labs while she is out of the office.  Please inform the patient that her albumin level is just slightly above normal. Her level is 5. This is consistent with levels measured in the past.  This may be elevated due to some relative dehydration, a high protein diet, or may just be normal for her.  This is not a critical value.  I would just retest next year with her annual exam here or with her PCP.   Cc- [name]

## 2016-11-09 NOTE — Telephone Encounter (Signed)
Spoke with patient, advised as seen below per Dr. Silva. Patient verbalizes understanding and is agreeable.  Patient is agreeable to disposition. Will close encounter.  

## 2016-12-16 ENCOUNTER — Encounter: Payer: Self-pay | Admitting: Certified Nurse Midwife

## 2017-10-25 ENCOUNTER — Ambulatory Visit: Payer: BC Managed Care – PPO | Admitting: Nurse Practitioner

## 2017-10-26 ENCOUNTER — Ambulatory Visit: Payer: BC Managed Care – PPO | Admitting: Nurse Practitioner

## 2017-10-26 NOTE — Progress Notes (Signed)
62 y.o. G2P2 Married  Caucasian Fe here for annual exam. Menopausal uses Research scientist (medical) . Denies vaginal bleeding or dryness.. Occasional hot flashes/night sweats. Continues to work on [employer] exercise and stress incontinence has not changed. Sees PCP Dr. Nancy Fetter as needed. Social stress with mother having hip replacement who is staying with her. Screening labs desired today. No other health issues today.  Patient's last menstrual period was 04/30/2008.          Sexually active: Yes.    The current method of family planning is post menopausal status.    Exercising: No.  exercise Smoker:  no  Review of Systems  Constitutional: Negative.   HENT: Negative.   Eyes: Negative.   Respiratory: Negative.   Cardiovascular: Negative.   Gastrointestinal: Negative.   Genitourinary: Negative.   Musculoskeletal: Negative.   Skin: Negative.   Neurological: Negative.   Endo/Heme/Allergies: Negative.   Psychiatric/Behavioral: Negative.    Health Maintenance: Pap:  09-30-15 neg HPV HR neg History of Abnormal Pap: no MMG:  10-25-17 neg per patient Self Breast exams: no Colonoscopy:  2012 neg f/u 48yrs BMD:   2012 TDaP:  2011 Shingles: no Pneumonia: no Hep C and HIV: both neg 2018 Labs: if needed   reports that she has never smoked. She has never used smokeless tobacco. She reports that she does not drink alcohol or use drugs.  Past Medical History:  Diagnosis Date  . Hyperalbuminemia    mildy elevated - check yearly with CMP  . Perimenopausal   . Skin cancer    shoulder-2015  . Wears glasses     Past Surgical History:  Procedure Laterality Date  . colonscopy     . TOTAL HIP ARTHROPLASTY Right 08/01/2015   Procedure: RIGHT TOTAL HIP ARTHROPLASTY ANTERIOR APPROACH;  Surgeon: Rod Can, MD;  Location: [facility];  Service: Orthopedics;  Laterality: Right;  Marland Kitchen VAGINAL DELIVERY     x2    Current Outpatient Medications  Medication Sig Dispense Refill  . aspirin 81 MG tablet Take 81 mg by mouth every  other day.    . Black Cohosh-SoyIsoflav-Magnol (ESTROVEN MENOPAUSE RELIEF) CAPS Take 1 capsule by mouth daily.     . Calcium Carb-Cholecalciferol 600-800 MG-UNIT TABS Take 1 tablet by mouth daily.    . Glucosamine-Chondroit-Vit C-Mn (GLUCOSAMINE 1500 COMPLEX) CAPS Take by mouth.    . Melatonin 3 MG CAPS Take 1 capsule by mouth at bedtime.    . Multiple Vitamin (MULTIVITAMIN) capsule Take 1 capsule by mouth daily.    . Omega-3 Fatty Acids (ULTRA OMEGA-3 FISH OIL) 1400 MG CAPS Take 1 capsule by mouth daily.     No current facility-administered medications for this visit.     Family History  Problem Relation Age of Onset  . Thyroid disease Mother   . Glaucoma Mother   . Heart disease Father   . Heart disease Paternal Aunt   . Breast cancer Paternal Grandmother     ROS:  Pertinent items are noted in HPI.  Otherwise, a comprehensive ROS was negative.  Exam:   BP 118/72   Pulse 72   Resp 16   Ht 5' 3.75" (1.619 m)   Wt 119 lb (54 kg)   LMP 04/30/2008   BMI 20.59 kg/m  Height: 5' 3.75" (161.9 cm) Ht Readings from Last 3 Encounters:  10/27/17 5' 3.75" (1.619 m)  10/20/16 5' 3.75" (1.619 m)  09/30/15 5' 3.5" (1.613 m)    General appearance: alert, cooperative and appears stated age Head: Normocephalic,  without obvious abnormality, atraumatic Neck: no adenopathy, supple, symmetrical, trachea midline and thyroid normal to inspection and palpation Lungs: clear to auscultation bilaterally Breasts: normal appearance, no masses or tenderness, No nipple retraction or dimpling, No nipple discharge or bleeding, No axillary or supraclavicular adenopathy Heart: regular rate and rhythm Abdomen: soft, non-tender; no masses,  no organomegaly Extremities: extremities normal, atraumatic, no cyanosis or edema Skin: Skin color, texture, turgor normal. No rashes or lesions Lymph nodes: Cervical, supraclavicular, and axillary nodes normal. No abnormal inguinal nodes palpated Neurologic: Grossly  normal   Pelvic: External genitalia:  no lesions              Urethra:  normal appearing urethra with no masses, tenderness or lesions              Bartholin's and Skene's: normal                 Vagina: normal appearing vagina with normal color and discharge, no lesions              Cervix: no cervical motion tenderness, no lesions and normal appearance              Pap taken: No. Bimanual Exam:  Uterus:  normal size, contour, position, consistency, mobility, non-tender              Adnexa: normal adnexa and no mass, fullness, tenderness               Rectovaginal: Confirms               Anus:  normal sphincter tone, no lesions  Chaperone present: yes  A:  Well Woman with normal exam  Menopausal on OTC Estroven   Social stress with mother's fall,coping well  Stress incontinence, occasional ,no change  Screening labs  P:   Reviewed health and wellness pertinent to exam  Aware of need to advise if vaginal bleeding  Discussed only using directed amount.  Seek family and friend support as needed.  Avoid holding urine for long periods of time, which increases occurrence. Will advise if change  Labs: Lipid panel, CMP, TSH ,CBC, Vitamin D  Pap smear: no   counseled on breast self exam, mammography screening, adequate intake of calcium and vitamin D, diet and exercise, Kegel's exercises  return annually or prn  An After Visit Summary was printed and given to the patient.

## 2017-10-27 ENCOUNTER — Ambulatory Visit: Payer: BC Managed Care – PPO | Admitting: Certified Nurse Midwife

## 2017-10-27 ENCOUNTER — Other Ambulatory Visit: Payer: Self-pay

## 2017-10-27 ENCOUNTER — Encounter: Payer: Self-pay | Admitting: Certified Nurse Midwife

## 2017-10-27 VITALS — BP 118/72 | HR 72 | Resp 16 | Ht 63.75 in | Wt 119.0 lb

## 2017-10-27 DIAGNOSIS — E559 Vitamin D deficiency, unspecified: Secondary | ICD-10-CM

## 2017-10-27 DIAGNOSIS — N951 Menopausal and female climacteric states: Secondary | ICD-10-CM

## 2017-10-27 DIAGNOSIS — Z0000 Encounter for general adult medical examination without abnormal findings: Secondary | ICD-10-CM

## 2017-10-27 DIAGNOSIS — Z01419 Encounter for gynecological examination (general) (routine) without abnormal findings: Secondary | ICD-10-CM

## 2017-10-27 DIAGNOSIS — R6889 Other general symptoms and signs: Secondary | ICD-10-CM | POA: Diagnosis not present

## 2017-10-27 NOTE — Patient Instructions (Signed)

## 2017-10-28 LAB — CBC
HEMATOCRIT: 44 % (ref 34.0–46.6)
Hemoglobin: 15.1 g/dL (ref 11.1–15.9)
MCH: 31.9 pg (ref 26.6–33.0)
MCHC: 34.3 g/dL (ref 31.5–35.7)
MCV: 93 fL (ref 79–97)
Platelets: 214 10*3/uL (ref 150–450)
RBC: 4.73 x10E6/uL (ref 3.77–5.28)
RDW: 13.1 % (ref 12.3–15.4)
WBC: 4 10*3/uL (ref 3.4–10.8)

## 2017-10-28 LAB — COMPREHENSIVE METABOLIC PANEL
ALT: 12 IU/L (ref 0–32)
AST: 20 IU/L (ref 0–40)
Albumin/Globulin Ratio: 2.3 — ABNORMAL HIGH (ref 1.2–2.2)
Albumin: 4.9 g/dL — ABNORMAL HIGH (ref 3.6–4.8)
Alkaline Phosphatase: 62 IU/L (ref 39–117)
BUN/Creatinine Ratio: 15 (ref 12–28)
BUN: 9 mg/dL (ref 8–27)
Bilirubin Total: 0.5 mg/dL (ref 0.0–1.2)
CO2: 24 mmol/L (ref 20–29)
Calcium: 9.8 mg/dL (ref 8.7–10.3)
Chloride: 103 mmol/L (ref 96–106)
Creatinine, Ser: 0.62 mg/dL (ref 0.57–1.00)
GFR calc Af Amer: 112 mL/min/{1.73_m2} (ref >59)
GFR calc non Af Amer: 97 mL/min/{1.73_m2} (ref >59)
Globulin, Total: 2.1 g/dL (ref 1.5–4.5)
Glucose: 82 mg/dL (ref 65–99)
Potassium: 4.1 mmol/L (ref 3.5–5.2)
Sodium: 144 mmol/L (ref 134–144)
Total Protein: 7 g/dL (ref 6.0–8.5)

## 2017-10-28 LAB — LIPID PANEL
Chol/HDL Ratio: 2.3 ratio (ref 0.0–4.4)
Cholesterol, Total: 208 mg/dL — ABNORMAL HIGH (ref 100–199)
HDL: 91 mg/dL (ref >39)
LDL Calculated: 103 mg/dL — ABNORMAL HIGH (ref 0–99)
Triglycerides: 70 mg/dL (ref 0–149)
VLDL Cholesterol Cal: 14 mg/dL (ref 5–40)

## 2017-10-28 LAB — VITAMIN D 25 HYDROXY (VIT D DEFICIENCY, FRACTURES): VIT D 25 HYDROXY: 53.1 ng/mL (ref 30.0–100.0)

## 2017-10-28 LAB — TSH: TSH: 4.74 u[IU]/mL — ABNORMAL HIGH (ref 0.450–4.500)

## 2017-11-11 ENCOUNTER — Encounter: Payer: Self-pay | Admitting: Certified Nurse Midwife

## 2018-11-01 ENCOUNTER — Encounter: Payer: Self-pay | Admitting: Certified Nurse Midwife

## 2018-11-01 ENCOUNTER — Ambulatory Visit: Payer: BC Managed Care – PPO | Admitting: Certified Nurse Midwife

## 2018-11-01 ENCOUNTER — Other Ambulatory Visit: Payer: Self-pay

## 2018-11-01 ENCOUNTER — Other Ambulatory Visit (HOSPITAL_COMMUNITY)
Admission: RE | Admit: 2018-11-01 | Discharge: 2018-11-01 | Disposition: A | Discharge status: Home / Self Care | Payer: BC Managed Care – PPO | Source: Ambulatory Visit | Attending: Certified Nurse Midwife | Admitting: Certified Nurse Midwife

## 2018-11-01 ENCOUNTER — Telehealth: Payer: Self-pay | Admitting: *Deleted

## 2018-11-01 VITALS — BP 108/64 | HR 68 | Temp 97.1°F | Resp 16 | Ht 63.5 in | Wt 120.0 lb

## 2018-11-01 DIAGNOSIS — Z0000 Encounter for general adult medical examination without abnormal findings: Secondary | ICD-10-CM

## 2018-11-01 DIAGNOSIS — E041 Nontoxic single thyroid nodule: Secondary | ICD-10-CM

## 2018-11-01 DIAGNOSIS — Z01411 Encounter for gynecological examination (general) (routine) with abnormal findings: Secondary | ICD-10-CM

## 2018-11-01 DIAGNOSIS — Z124 Encounter for screening for malignant neoplasm of cervix: Secondary | ICD-10-CM

## 2018-11-01 DIAGNOSIS — E559 Vitamin D deficiency, unspecified: Secondary | ICD-10-CM

## 2018-11-01 NOTE — Telephone Encounter (Signed)
Order placed for Thyroid US at [facility].

## 2018-11-01 NOTE — Telephone Encounter (Signed)
-----   Message from Regina Eck, CNM sent at 11/01/2018  1:59 PM EDT ----- Has right thyroid nodule needs [place] with GI, patient aware she will be called with information

## 2018-11-01 NOTE — Progress Notes (Signed)
63 y.o. G2P2 Married  Caucasian Fe here for annual exam. Menopausal no vaginal bleeding. Hot flashes still occurring. Uses vaginal lubricant for dryness. Sees PCP prn. Exercising with 20 year old mother. Screening labs today. 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 No other health issues today.  Patient's last menstrual period was 04/30/2008.          Sexually active: Yes.    The current method of family planning is post menopausal status.    Exercising: Yes.    walking Smoker:  no  Review of Systems  Constitutional: Negative.   HENT: Negative.   Eyes: Negative.   Respiratory: Negative.   Cardiovascular: Negative.   Gastrointestinal: Negative.   Genitourinary: Negative.   Musculoskeletal: Negative.   Skin: Negative.   Neurological: Negative.   Endo/Heme/Allergies: Negative.   Psychiatric/Behavioral: Negative.     Health Maintenance: Pap:  09-30-15 neg HPV HR neg History of Abnormal Pap: no MMG:  10-25-17 category c density birads 1:neg has not scheduled Self Breast exams: no Colonoscopy:  2012 neg f/u 1yrs BMD:   2012 TDaP:  2011 Shingles: not done Pneumonia: not done Hep C and HIV: both neg 2018 Labs: yes   reports that she has never smoked. She has never used smokeless tobacco. She reports that she does not drink alcohol or use drugs.  Past Medical History:  Diagnosis Date  . Hyperalbuminemia    mildy elevated - check yearly with CMP  . Perimenopausal   . Skin cancer    shoulder-2015  . Wears glasses     Past Surgical History:  Procedure Laterality Date  . colonscopy     . TOTAL HIP ARTHROPLASTY Right 08/01/2015   Procedure: RIGHT TOTAL HIP ARTHROPLASTY ANTERIOR APPROACH;  Surgeon: Rod Can, MD;  Location: [facility];  Service: Orthopedics;  Laterality: Right;  Marland Kitchen VAGINAL DELIVERY     x2    Current Outpatient Medications  Medication Sig Dispense Refill  . aspirin 81 MG tablet Take 81 mg by mouth every other day.    . Black  Cohosh-SoyIsoflav-Magnol (ESTROVEN MENOPAUSE RELIEF) CAPS Take 1 capsule by mouth daily.     . Calcium Carb-Cholecalciferol 600-800 MG-UNIT TABS Take 1 tablet by mouth daily.    . Glucosamine-Chondroit-Vit C-Mn (GLUCOSAMINE 1500 COMPLEX) CAPS Take by mouth.    . Melatonin 3 MG CAPS Take 1 capsule by mouth at bedtime.    . Multiple Vitamin (MULTIVITAMIN) capsule Take 1 capsule by mouth daily.    . Omega-3 Fatty Acids (ULTRA OMEGA-3 FISH OIL) 1400 MG CAPS Take 1 capsule by mouth daily.     No current facility-administered medications for this visit.     Family History  Problem Relation Age of Onset  . Thyroid disease Mother   . Glaucoma Mother   . Heart disease Father   . Heart disease Paternal Aunt   . Breast cancer Paternal Grandmother     ROS:  Pertinent items are noted in HPI.  Otherwise, a comprehensive ROS was negative.  Exam:   LMP 04/30/2008    Ht Readings from Last 3 Encounters:  10/27/17 5' 3.75" (1.619 m)  10/20/16 5' 3.75" (1.619 m)  09/30/15 5' 3.5" (1.613 m)    General appearance: alert, cooperative and appears stated age Head: Normocephalic, without obvious abnormality, atraumatic Neck: no adenopathy, supple, symmetrical, trachea midline and thyroid normal to inspection and palpation Lungs: clear to auscultation bilaterally Breasts: normal appearance, no masses or tenderness, No nipple retraction or dimpling,  No nipple discharge or bleeding, No axillary or supraclavicular adenopathy Heart: regular rate and rhythm Abdomen: soft, non-tender; no masses,  no organomegaly Extremities: extremities normal, atraumatic, no cyanosis or edema Skin: Skin color, texture, turgor normal. No rashes or lesions Lymph nodes: Cervical, supraclavicular, and axillary nodes normal. No abnormal inguinal nodes palpated Neurologic: Grossly normal  Physical Exam Neck:      Pelvic: External genitalia:  no lesions              Urethra:  normal appearing urethra with no masses,  tenderness or lesions              Bartholin's and Skene's: normal                 Vagina: normal appearing vagina with normal color and discharge, no lesions              Cervix: no bleeding following Pap, no cervical motion tenderness and no lesions              Pap taken: Yes.   Bimanual Exam:  Uterus:  normal size, contour, position, consistency, mobility, non-tender              Adnexa: normal adnexa and no mass, fullness, tenderness               Rectovaginal: Confirms               Anus:  normal sphincter tone, no lesions  Chaperone present: yes  A:  Well Woman with normal exam  Menopausal no HRT  Vaginal dryness  Right Thyroid nodule  BMD due  Screening labs today  P:   Reviewed health and wellness pertinent to exam  Aware of need to advise if vaginal bleeding  Discussed coconut oil for moisture as needed and for sexual activity.  Discussed finding of thyroid and need for [place] to evaluate. Questions addressed. Patient will be called with information regarding appointment.  BMD order will be faxed to [facility]: CMP, Lipid panel, CBC, Vitamin D  Pap smear: yes   counseled on breast self exam, mammography screening, feminine hygiene, menopause, osteoporosis, adequate intake of calcium and vitamin D  return annually or prn  An After Visit Summary was printed and given to the patient.

## 2018-11-02 LAB — TSH: TSH: 3.87 u[IU]/mL (ref 0.450–4.500)

## 2018-11-02 LAB — COMPREHENSIVE METABOLIC PANEL
ALT: 12 IU/L (ref 0–32)
AST: 20 IU/L (ref 0–40)
Albumin/Globulin Ratio: 2.6 — ABNORMAL HIGH (ref 1.2–2.2)
Albumin: 4.9 g/dL — ABNORMAL HIGH (ref 3.8–4.8)
Alkaline Phosphatase: 59 IU/L (ref 39–117)
BUN/Creatinine Ratio: 29 — ABNORMAL HIGH (ref 12–28)
BUN: 16 mg/dL (ref 8–27)
Bilirubin Total: 0.3 mg/dL (ref 0.0–1.2)
CO2: 26 mmol/L (ref 20–29)
Calcium: 9.6 mg/dL (ref 8.7–10.3)
Chloride: 101 mmol/L (ref 96–106)
Creatinine, Ser: 0.55 mg/dL — ABNORMAL LOW (ref 0.57–1.00)
GFR calc Af Amer: 115 mL/min/{1.73_m2} (ref >59)
GFR calc non Af Amer: 100 mL/min/{1.73_m2} (ref >59)
Globulin, Total: 1.9 g/dL (ref 1.5–4.5)
Glucose: 92 mg/dL (ref 65–99)
Potassium: 4.1 mmol/L (ref 3.5–5.2)
Sodium: 141 mmol/L (ref 134–144)
Total Protein: 6.8 g/dL (ref 6.0–8.5)

## 2018-11-02 LAB — LIPID PANEL
Chol/HDL Ratio: 2.3 ratio (ref 0.0–4.4)
Cholesterol, Total: 191 mg/dL (ref 100–199)
HDL: 84 mg/dL (ref >39)
LDL Calculated: 86 mg/dL (ref 0–99)
Triglycerides: 104 mg/dL (ref 0–149)
VLDL Cholesterol Cal: 21 mg/dL (ref 5–40)

## 2018-11-02 LAB — CBC
Hematocrit: 43.3 % (ref 34.0–46.6)
Hemoglobin: 14.9 g/dL (ref 11.1–15.9)
MCH: 30.9 pg (ref 26.6–33.0)
MCHC: 34.4 g/dL (ref 31.5–35.7)
MCV: 90 fL (ref 79–97)
Platelets: 221 10*3/uL (ref 150–450)
RBC: 4.82 x10E6/uL (ref 3.77–5.28)
RDW: 12.2 % (ref 11.7–15.4)
WBC: 4.3 10*3/uL (ref 3.4–10.8)

## 2018-11-02 LAB — VITAMIN D 25 HYDROXY (VIT D DEFICIENCY, FRACTURES): Vit D, 25-Hydroxy: 40.1 ng/mL (ref 30.0–100.0)

## 2018-11-02 NOTE — Telephone Encounter (Signed)
Per review of Epic, patient is scheduled for Thyroid US at [facility] on 11/11/18 at 1pm.   Routing to Melvia Heaps, CNM FYI.   Encounter closed.

## 2018-11-03 LAB — CYTOLOGY - PAP
Diagnosis: NEGATIVE
HPV: NOT DETECTED

## 2018-11-04 ENCOUNTER — Telehealth: Payer: Self-pay

## 2018-11-04 NOTE — Telephone Encounter (Signed)
Patient was at a drs appt with her mother. Patient will call me back.

## 2018-11-04 NOTE — Telephone Encounter (Signed)
Patient notified of results. See lab 

## 2018-11-04 NOTE — Telephone Encounter (Signed)
-----   Message from Regina Eck, CNM sent at 11/02/2018  4:53 PM EDT ----- Notify patient her Lipid panel was normal with low risk profile. Cholesterol at 191 last year 208, Triglycerides at 104, HDL at 84, LDL at 86 Liver, kidney and glucose essential normal TSH is normal Vitamin D is normal at 40.1 but has decreased. Would recommend 1000 IU Vitamin D 3 daily CBC is normal no anemia Pap pending

## 2018-11-11 ENCOUNTER — Ambulatory Visit
Admission: RE | Admit: 2018-11-11 | Discharge: 2018-11-11 | Disposition: A | Discharge status: Home / Self Care | Payer: BC Managed Care – PPO | Source: Ambulatory Visit | Attending: Certified Nurse Midwife | Admitting: Certified Nurse Midwife

## 2018-11-11 ENCOUNTER — Encounter: Payer: Self-pay | Admitting: Certified Nurse Midwife

## 2018-11-11 DIAGNOSIS — E041 Nontoxic single thyroid nodule: Secondary | ICD-10-CM

## 2018-11-14 ENCOUNTER — Telehealth: Payer: Self-pay | Admitting: *Deleted

## 2018-11-14 NOTE — Telephone Encounter (Signed)
Patient returned call and notified of results as seen below from Debbi. Patient verbalized understanding.   Encounter closed.

## 2018-11-14 NOTE — Telephone Encounter (Signed)
Message left to return call to Triage Nurse at [phone].    

## 2018-11-14 NOTE — Telephone Encounter (Signed)
-----   Message from Regina Eck, CNM sent at 11/13/2018  7:08 AM EDT ----- Notify patient her thyroid US showed only a few colloid cysts bilaterally, no worrisome nodules.Normal size thyroid. No further follow up needed. Will recheck at next exam

## 2018-11-18 ENCOUNTER — Telehealth: Payer: Self-pay

## 2018-11-18 NOTE — Telephone Encounter (Signed)
Patient notified of bone density results. Office visit appt appointment scheduled for 12-07-2018 at 4pm to discuss results.

## 2018-11-29 NOTE — Progress Notes (Signed)
GYNECOLOGY  VISIT   HPI: 63 y.o.   Married Other or two or more races Not Hispanic or Latino  female   G2P2 with Patient's last menstrual period was 04/30/2008.   here to discuss BMD.  Recent DEXA returned with a T Score of -2.6 at her left hip.   GYNECOLOGIC HISTORY: Patient's last menstrual period was 04/30/2008. Contraception: Postmenopausal Menopausal hormone therapy: None, uses OTC Estroven        OB History    Gravida  2   Para  2   Term      Preterm      AB      Living  2     SAB      TAB      Ectopic      Multiple      Live Births                 Patient Active Problem List   Diagnosis Date Noted  . Primary osteoarthritis of right hip 08/01/2015    Past Medical History:  Diagnosis Date  . Hyperalbuminemia    mildy elevated - check yearly with CMP  . Perimenopausal   . Skin cancer    shoulder-2015  . Wears glasses     Past Surgical History:  Procedure Laterality Date  . colonscopy     . TOTAL HIP ARTHROPLASTY Right 08/01/2015   Procedure: RIGHT TOTAL HIP ARTHROPLASTY ANTERIOR APPROACH;  Surgeon: Rod Can, MD;  Location: [facility];  Service: Orthopedics;  Laterality: Right;  Marland Kitchen VAGINAL DELIVERY     x2    Current Outpatient Medications  Medication Sig Dispense Refill  . aspirin 81 MG tablet Take 81 mg by mouth every other day.    . Black Cohosh-SoyIsoflav-Magnol (ESTROVEN MENOPAUSE RELIEF) CAPS Take 1 capsule by mouth daily.     . Calcium Carb-Cholecalciferol 600-800 MG-UNIT TABS Take 1 tablet by mouth daily.    . Glucosamine-Chondroit-Vit C-Mn (GLUCOSAMINE 1500 COMPLEX) CAPS Take by mouth.    . Melatonin 3 MG CAPS Take 1 capsule by mouth at bedtime.    . Multiple Vitamin (MULTIVITAMIN) capsule Take 1 capsule by mouth daily.    . Omega-3 Fatty Acids (ULTRA OMEGA-3 FISH OIL) 1400 MG CAPS Take 1 capsule by mouth daily.     No current facility-administered medications for this visit.      ALLERGIES: Penicillins  Family History   Problem Relation Age of Onset  . Thyroid disease Mother   . Glaucoma Mother   . Heart disease Father   . Heart disease Paternal Aunt   . Breast cancer Paternal Grandmother     Social History   Socioeconomic History  . Marital status: Married    Spouse name: Not on file  . Number of children: Not on file  . Years of education: Not on file  . Highest education level: Not on file  Occupational History  . Not on file  Social Needs  . Financial resource strain: Not on file  . Food insecurity    Worry: Not on file    Inability: Not on file  . Transportation needs    Medical: Not on file    Non-medical: Not on file  Tobacco Use  . Smoking status: Never Smoker  . Smokeless tobacco: Never Used  Substance and Sexual Activity  . Alcohol use: No    Alcohol/week: 0.0 standard drinks  . Drug use: No  . Sexual activity: Yes    Partners: Male  Birth control/protection: Post-menopausal  Lifestyle  . Physical activity    Days per week: Not on file    Minutes per session: Not on file  . Stress: Not on file  Relationships  . Social Herbalist on phone: Not on file    Gets together: Not on file    Attends religious service: Not on file    Active member of club or organization: Not on file    Attends meetings of clubs or organizations: Not on file    Relationship status: Not on file  . Intimate partner violence    Fear of current or ex partner: Not on file    Emotionally abused: Not on file    Physically abused: Not on file    Forced sexual activity: Not on file  Other Topics Concern  . Not on file  Social History Narrative  . Not on file    Review of Systems  Constitutional: Negative.   HENT: Negative.   Eyes: Negative.   Respiratory: Negative.   Cardiovascular: Negative.   Gastrointestinal: Negative.   Genitourinary: Negative.   Musculoskeletal: Negative.   Skin: Negative.   Neurological: Negative.   Endo/Heme/Allergies: Negative.    Psychiatric/Behavioral: Negative.     PHYSICAL EXAMINATION:    BP 106/60 (BP Location: Right Arm, Patient Position: Sitting, Cuff Size: Normal)   Pulse 76   Temp 97.7 F (36.5 C) (Skin)   Ht 5' 3.5" (1.613 m)   Wt 120 lb 3.2 oz (54.5 kg)   LMP 04/30/2008   BMI 20.96 kg/m     General appearance: alert, cooperative and appears stated age  ASSESSMENT Osteoporosis. Recent normal lab work, including vit d     PLAN Discussed osteoporosis and risk of fracture Treatment recommended, discussed risks and side effects of Fosamax and prolia. Recommend Fosamax, no contraindications. Information on osteoporosis given Repeat DEXA in 8/22   An After Visit Summary was printed and given to the patient.  ~15 minutes face to face time of which over 50% was spent in counseling.   CC: [name], CNM

## 2018-12-02 ENCOUNTER — Other Ambulatory Visit: Payer: Self-pay

## 2018-12-07 ENCOUNTER — Ambulatory Visit: Payer: BC Managed Care – PPO | Admitting: Obstetrics and Gynecology

## 2018-12-07 ENCOUNTER — Other Ambulatory Visit: Payer: Self-pay

## 2018-12-07 ENCOUNTER — Encounter: Payer: Self-pay | Admitting: Obstetrics and Gynecology

## 2018-12-07 VITALS — BP 106/60 | HR 76 | Temp 97.7°F | Ht 63.5 in | Wt 120.2 lb

## 2018-12-07 DIAGNOSIS — M810 Age-related osteoporosis without current pathological fracture: Secondary | ICD-10-CM | POA: Diagnosis not present

## 2018-12-07 MED ORDER — ALENDRONATE SODIUM 70 MG PO TABS: 70.0000 mg | ORAL_TABLET | ORAL | 3 refills | Status: DC

## 2018-12-07 NOTE — Patient Instructions (Signed)
Osteoporosis  Osteoporosis is thinning and loss of density in your bones. Osteoporosis makes bones more brittle and fragile and more likely to break (fracture). Over time, osteoporosis can cause your bones to become so weak that they fracture after a minor fall. Bones in the hip, wrist, and spine are most likely to fracture due to osteoporosis. What are the causes? The exact cause of this condition is not known. What increases the risk? You may be at greater risk for osteoporosis if you:  Have a family history of the condition.  Have poor nutrition.  Use steroid medicines, such as prednisone.  Are female.  Are age 64 or older.  Smoke or have a history of smoking.  Are not physically active (are sedentary).  Are white (Caucasian) or of Asian descent.  Have a small body frame.  Take certain medicines, such as antiseizure medicines. What are the signs or symptoms? A fracture might be the first sign of osteoporosis, especially if the fracture results from a fall or injury that usually would not cause a bone to break. Other signs and symptoms include:  Pain in the neck or low back.  Stooped posture.  Loss of height. How is this diagnosed? This condition may be diagnosed based on:  Your medical history.  A physical exam.  A bone mineral density test, also called a DXA or DEXA test (dual-energy X-ray absorptiometry test). This test uses X-rays to measure the amount of minerals in your bones. How is this treated? The goal of treatment is to strengthen your bones and lower your risk for a fracture. Treatment may involve:  Making lifestyle changes, such as: ? Including foods with more calcium and vitamin D in your diet. ? Doing weight-bearing and muscle-strengthening exercises. ? Stopping tobacco use. ? Limiting alcohol intake.  Taking medicine to slow the process of bone loss or to increase bone density.  Taking daily supplements of calcium and vitamin D.  Taking  hormone replacement medicines, such as estrogen for women and testosterone for men.  Monitoring your levels of calcium and vitamin D. Follow these instructions at home:  Activity  Exercise as told by your health care provider. Ask your health care provider what exercises and activities are safe for you. You should do: ? Exercises that make you work against gravity (weight-bearing exercises), such as tai chi, yoga, or walking. ? Exercises to strengthen muscles, such as lifting weights. Lifestyle  Limit alcohol intake to no more than 1 drink a day for nonpregnant women and 2 drinks a day for men. One drink equals 12 oz of beer, 5 oz of wine, or 1 oz of hard liquor.  Do not use any products that contain nicotine or tobacco, such as cigarettes and e-cigarettes. If you need help quitting, ask your health care provider. Preventing falls  Use devices to help you move around (mobility aids) as needed, such as canes, walkers, scooters, or crutches.  Keep rooms well-lit and clutter-free.  Remove tripping hazards from walkways, including cords and throw rugs.  Install grab bars in bathrooms and safety rails on stairs.  Use rubber mats in the bathroom and other areas that are often wet or slippery.  Wear closed-toe shoes that fit well and support your feet. Wear shoes that have rubber soles or low heels.  Review your medicines with your health care provider. Some medicines can cause dizziness or changes in blood pressure, which can increase your risk of falling. General instructions  Include calcium and vitamin D in  your diet. Calcium is important for bone health, and vitamin D helps your body to absorb calcium. Good sources of calcium and vitamin D include: ? Certain fatty fish, such as salmon and tuna. ? Products that have calcium and vitamin D added to them (fortified products), such as fortified cereals. ? Egg yolks. ? Cheese. ? Liver.  Take over-the-counter and prescription medicines  only as told by your health care provider.  Keep all follow-up visits as told by your health care provider. This is important. Contact a health care provider if:  You have never been screened for osteoporosis and you are: ? A woman who is age 61 or older. ? A man who is age 63 or older. Get help right away if:  You fall or injure yourself. Summary  Osteoporosis is thinning and loss of density in your bones. This makes bones more brittle and fragile and more likely to break (fracture),even with minor falls.  The goal of treatment is to strengthen your bones and reduce your risk for a fracture.  Include calcium and vitamin D in your diet. Calcium is important for bone health, and vitamin D helps your body to absorb calcium.  Talk with your health care provider about screening for osteoporosis if you are a woman who is age 48 or older, or a man who is age 48 or older. This information is not intended to replace advice given to you by your health care provider. Make sure you discuss any questions you have with your health care provider. Document Released: 12/24/2004 Document Revised: 02/26/2017 Document Reviewed: 01/08/2017 Elsevier Patient Education  2020 [employer].

## 2019-06-21 ENCOUNTER — Encounter: Payer: Self-pay | Admitting: Certified Nurse Midwife

## 2019-10-23 IMAGING — US US THYROID
1 series · 14 of 25 positions shown · non-contrast
Comparison: None.

CLINICAL DATA: Right nodule on physical exam

EXAM:
THYROID ULTRASOUND
TECHNIQUE: Ultrasound examination of the thyroid gland and adjacent soft
tissues was performed.

[Series 1: us thyroid · 0.04mm/px · 14 of 54 slices shown]
[im 1/54]
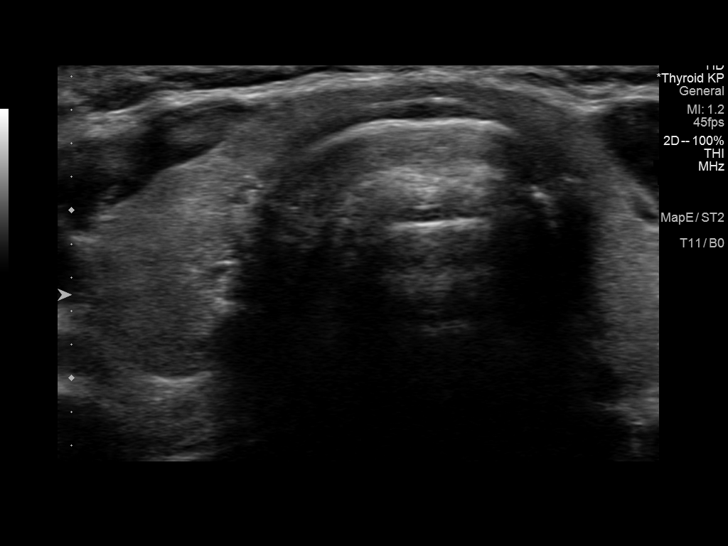
[im 5/54]
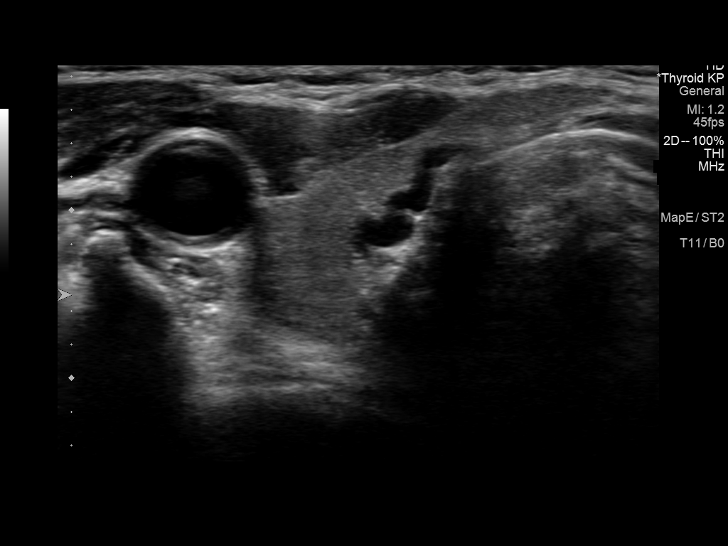
[im 9/54]
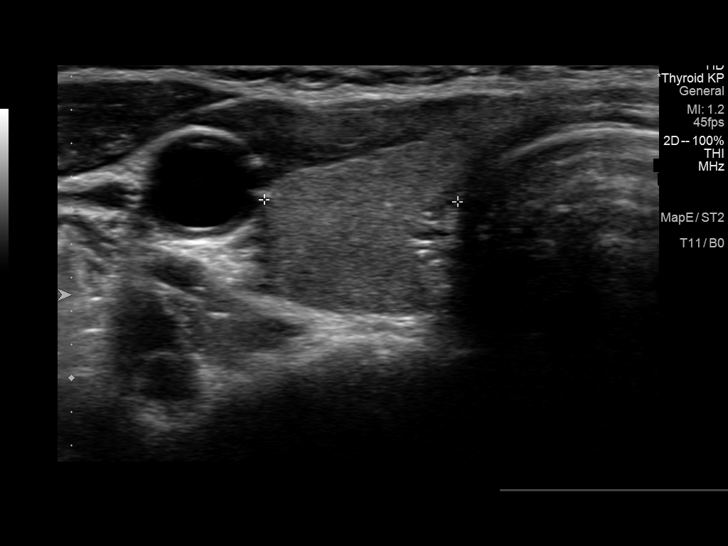
[im 14/54]
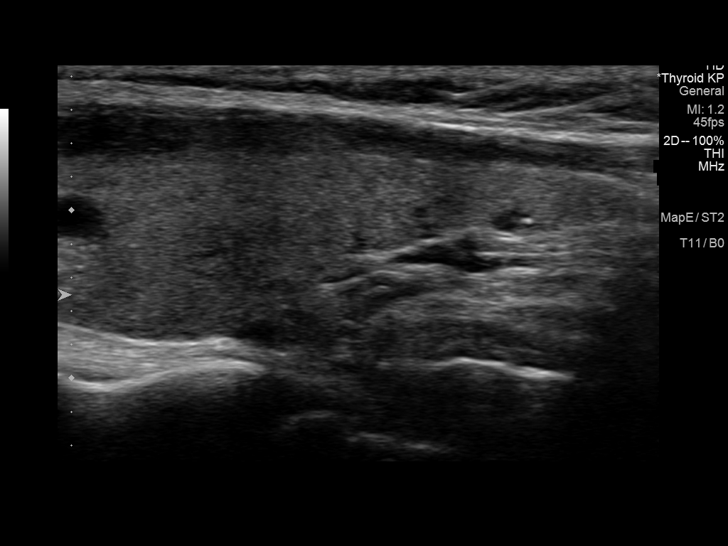
[im 18/54]
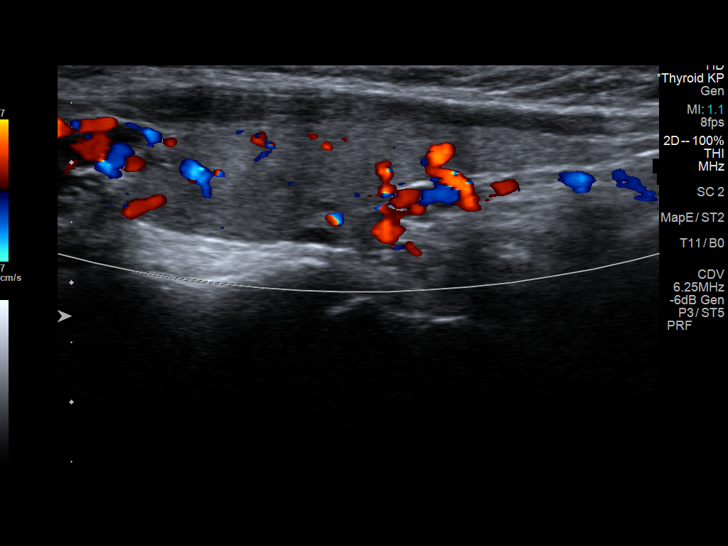
[im 20/54]
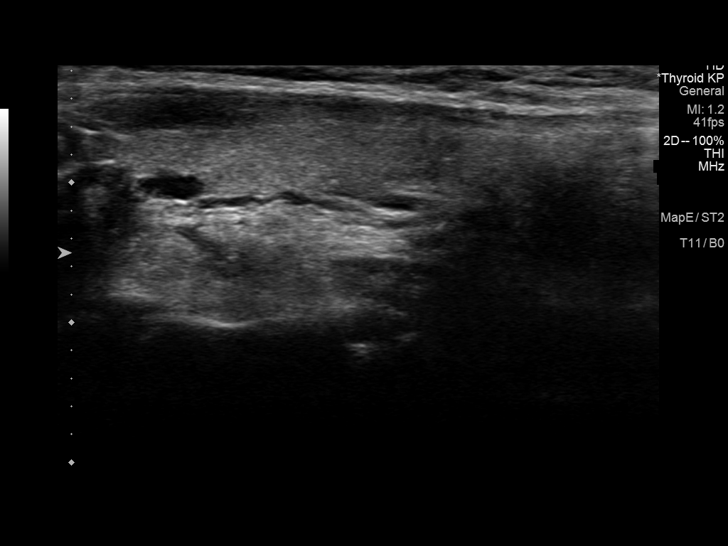
[im 25/54]
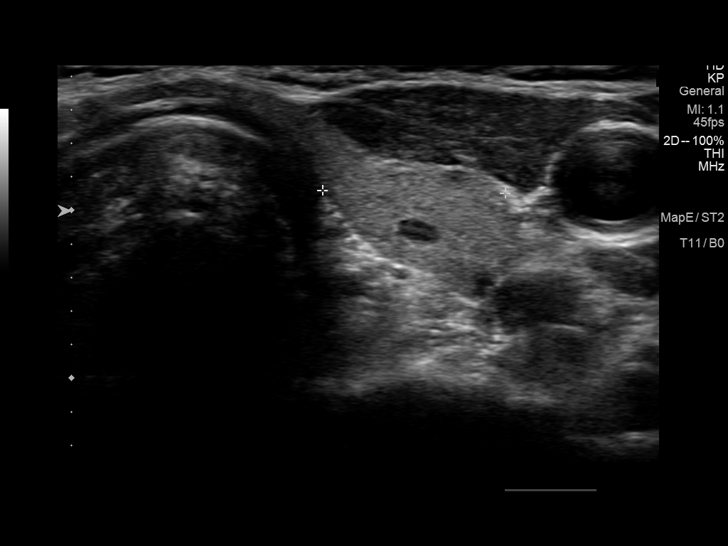
[im 29/54]
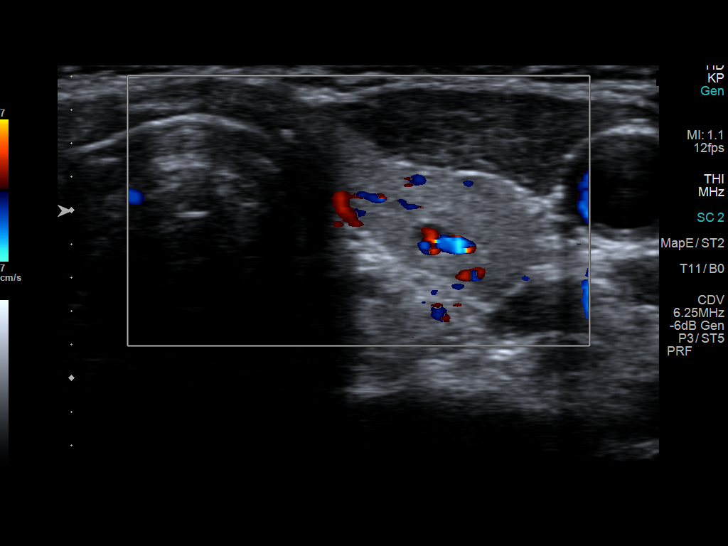
[im 34/54]
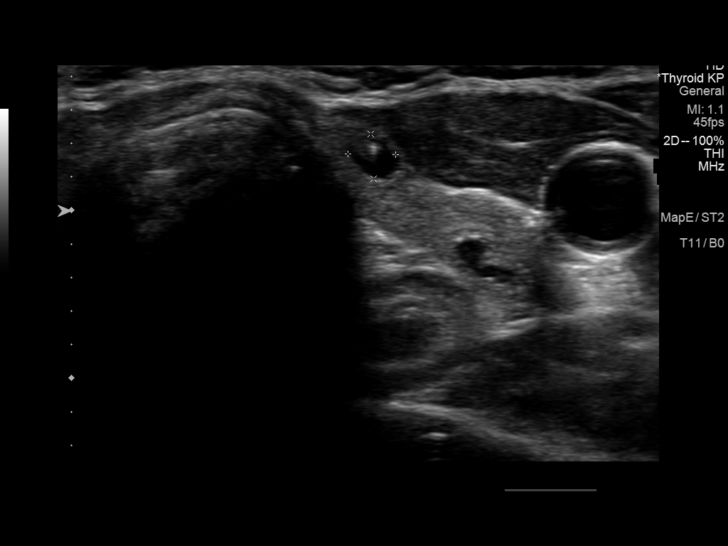
[im 36/54]
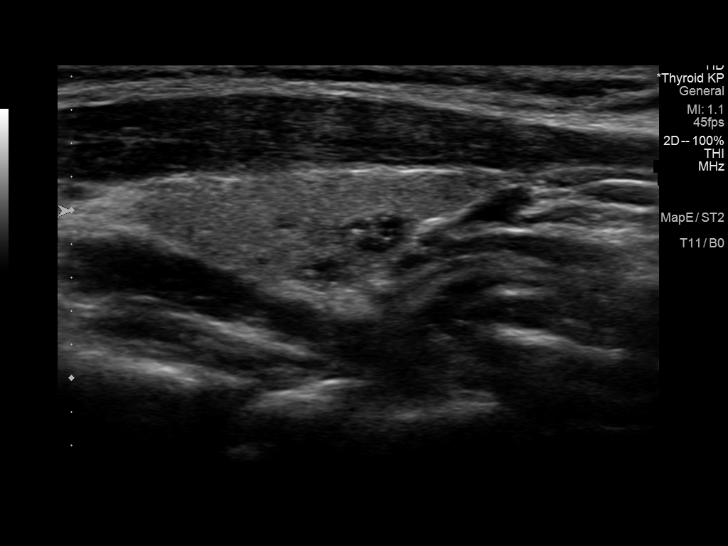
[im 40/54]
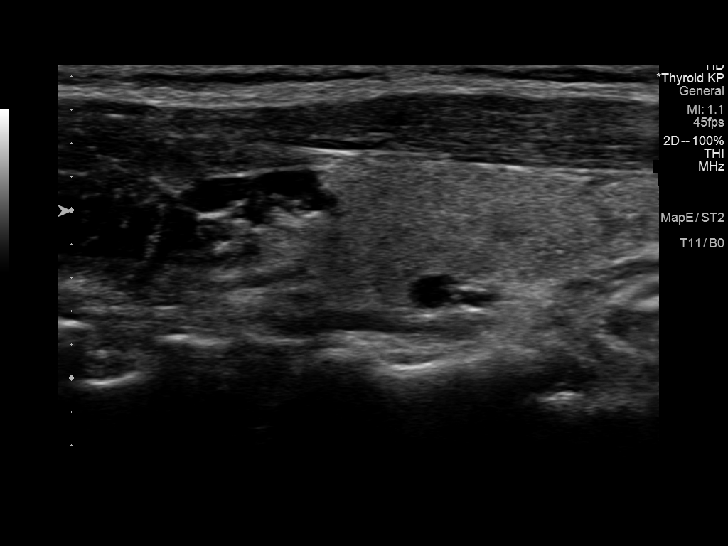
[im 45/54]
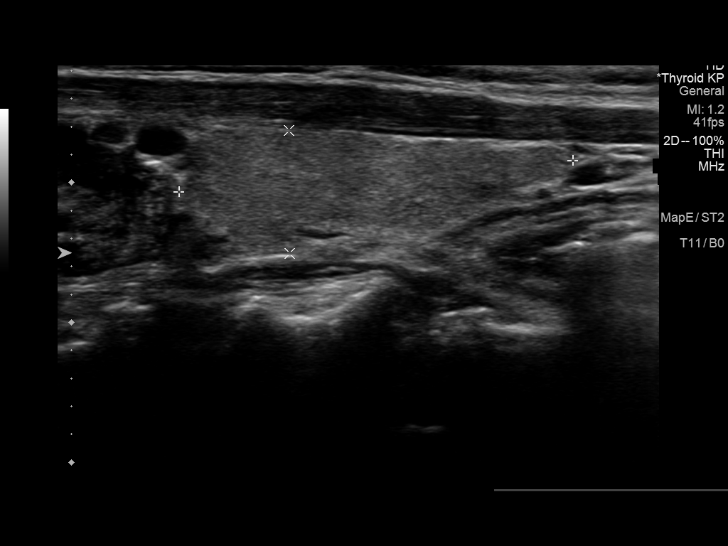
[im 49/54]
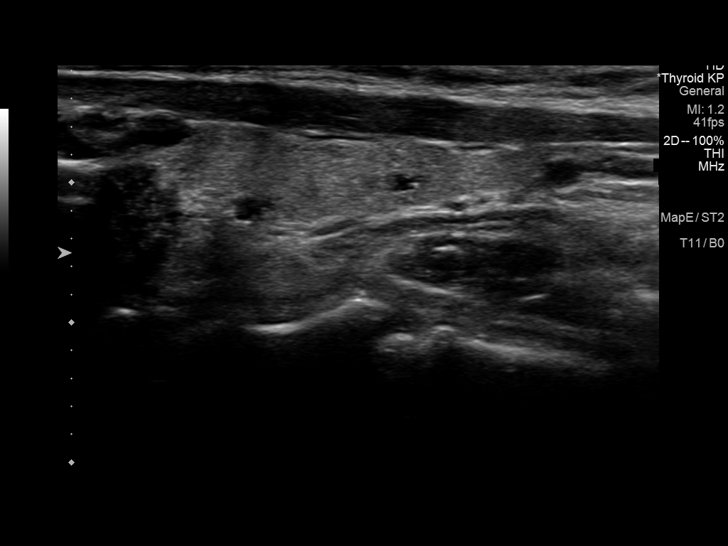
[im 54/54]
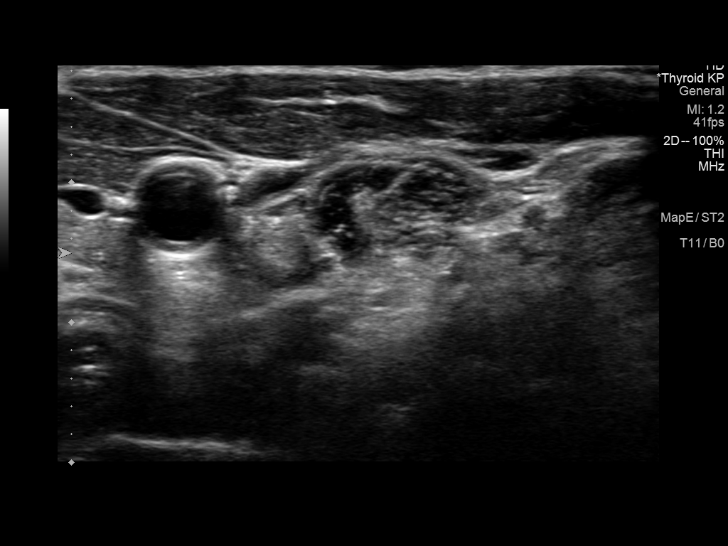

[14 of 25 positions shown; findings below may reference images not displayed]

FINDINGS: Parenchymal Echotexture: Mildly heterogenous

Isthmus: 0.1 cm thickness

Right lobe: 4.2 x 1 x 1.2 cm

Left lobe: 2.8 x 0.9 x 1.1 cm

_________________________________________________________

Estimated total number of nodules >/= 1 cm: 0

Number of spongiform nodules >/=  2 cm not described below (TR1): 0

Number of mixed cystic and solid nodules >/= 1.5 cm not described
below (TR2): 0

_________________________________________________________

A few scattered colloid cysts bilaterally none greater than 0.5 cm.
No solid or worrisome nodule.
IMPRESSION: Normal-sized thyroid. No nodule or other indication for biopsy or
dedicated imaging follow-up.

The above is in keeping with the ACR TI-RADS recommendations - [HOSPITAL] 4882;[DATE].

## 2019-11-03 ENCOUNTER — Ambulatory Visit: Payer: BC Managed Care – PPO | Admitting: Certified Nurse Midwife

## 2019-11-12 ENCOUNTER — Other Ambulatory Visit: Payer: Self-pay | Admitting: Obstetrics and Gynecology

## 2019-11-15 ENCOUNTER — Other Ambulatory Visit: Payer: Self-pay | Admitting: Obstetrics and Gynecology

## 2019-11-15 MED ORDER — ALENDRONATE SODIUM 70 MG PO TABS: 70.0000 mg | ORAL_TABLET | ORAL | 1 refills | Status: DC

## 2019-11-15 NOTE — Telephone Encounter (Signed)
Patient requesting refill on alendronate soduim. Aex scheduled for 04/11/20 which is the next available.

## 2019-11-15 NOTE — Telephone Encounter (Signed)
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   Medication pended for #12, 1RF. Please refill if appropriate.

## 2019-12-09 ENCOUNTER — Encounter: Payer: Self-pay | Admitting: Obstetrics and Gynecology

## 2020-04-11 ENCOUNTER — Ambulatory Visit (INDEPENDENT_AMBULATORY_CARE_PROVIDER_SITE_OTHER): Payer: BC Managed Care – PPO | Admitting: Obstetrics and Gynecology

## 2020-04-11 ENCOUNTER — Encounter: Payer: Self-pay | Admitting: Obstetrics and Gynecology

## 2020-04-11 ENCOUNTER — Other Ambulatory Visit: Payer: Self-pay

## 2020-04-11 VITALS — BP 120/80 | HR 84 | Resp 18 | Ht 62.75 in | Wt 114.0 lb

## 2020-04-11 DIAGNOSIS — Z1211 Encounter for screening for malignant neoplasm of colon: Secondary | ICD-10-CM

## 2020-04-11 DIAGNOSIS — Z01419 Encounter for gynecological examination (general) (routine) without abnormal findings: Secondary | ICD-10-CM | POA: Diagnosis not present

## 2020-04-11 DIAGNOSIS — Z23 Encounter for immunization: Secondary | ICD-10-CM

## 2020-04-11 DIAGNOSIS — N951 Menopausal and female climacteric states: Secondary | ICD-10-CM | POA: Insufficient documentation

## 2020-04-11 DIAGNOSIS — M810 Age-related osteoporosis without current pathological fracture: Secondary | ICD-10-CM | POA: Diagnosis not present

## 2020-04-11 DIAGNOSIS — M1610 Unilateral primary osteoarthritis, unspecified hip: Secondary | ICD-10-CM | POA: Insufficient documentation

## 2020-04-11 MED ORDER — ALENDRONATE SODIUM 70 MG PO TABS: 70.0000 mg | ORAL_TABLET | ORAL | 3 refills | Status: DC

## 2020-04-11 NOTE — Progress Notes (Signed)
65 y.o. G2P2 Married Other or two or more races Not Hispanic or Latino female here for annual exam.  No vaginal bleeding. No dyspareunia with lubrication.   On fosamax for osteoporosis.     Patient's last menstrual period was 04/30/2008.          Sexually active: Yes.    The current method of family planning is post menopausal status.    Exercising: No.  The patient does not participate in regular exercise at present. Smoker:  no  Health Maintenance: Pap:   11/01/18 Neg:Neg HR HPV  09/30/15 Neg:Neg HR HPV History of abnormal Pap:  no MMG:  9/112021 with [facility], negative. BMD:   11/11/18 Osteoporosis  Colonoscopy: 2012 Normal f/u 10 years TDaP:  unsure Gardasil: n/a   reports that she has never smoked. She has never used smokeless tobacco. She reports that she does not drink alcohol and does not use drugs. Kindergarten teacher  Past Medical History:  Diagnosis Date  . Hyperalbuminemia    mildy elevated - check yearly with CMP  . Perimenopausal   . Skin cancer    shoulder-2015  . Wears glasses     Past Surgical History:  Procedure Laterality Date  . colonscopy     . TOTAL HIP ARTHROPLASTY Right 08/01/2015   Procedure: RIGHT TOTAL HIP ARTHROPLASTY ANTERIOR APPROACH;  Surgeon: Rod Can, MD;  Location: [facility];  Service: Orthopedics;  Laterality: Right;  Marland Kitchen VAGINAL DELIVERY     x2    Current Outpatient Medications  Medication Sig Dispense Refill  . alendronate (FOSAMAX) 70 MG tablet Take 1 tablet (70 mg total) by mouth every 7 (seven) days. Take with a full glass of water on an empty stomach. 12 tablet 1  . aspirin 81 MG tablet Take 81 mg by mouth every other day.    . Black Cohosh-SoyIsoflav-Magnol (ESTROVEN MENOPAUSE RELIEF) CAPS Take 1 capsule by mouth daily.     . Boswellia-Glucosamine-Vit D (OSTEO BI-FLEX ONE PER DAY) TABS See admin instructions.    . Calcium Carb-Cholecalciferol 600-800 MG-UNIT TABS Take 1 tablet by mouth daily.    . Lutein-Zeaxanthin 25-5 MG CAPS See  admin instructions.    . Melatonin 3 MG CAPS Take 1 capsule by mouth at bedtime.    . Multiple Vitamins-Minerals (CENTRUM SILVER ADULT 50+) TABS 1 tablet    . Omega-3 Fatty Acids (ULTRA OMEGA-3 FISH OIL) 1400 MG CAPS Take 1 capsule by mouth daily.     No current facility-administered medications for this visit.    Family History  Problem Relation Age of Onset  . Thyroid disease Mother   . Glaucoma Mother   . Heart disease Father   . Heart disease Paternal Aunt   . Breast cancer Paternal Grandmother     Review of Systems  Constitutional: Negative.   HENT: Negative.   Eyes: Negative.   Respiratory: Negative.   Cardiovascular: Negative.   Gastrointestinal: Negative.   Endocrine: Negative.   Genitourinary: Negative.   Musculoskeletal: Negative.   Skin: Negative.   Allergic/Immunologic: Negative.   Neurological: Negative.   Hematological: Negative.   Psychiatric/Behavioral: Negative.     Exam:   BP 120/80 (BP Location: Left Arm, Patient Position: Sitting, Cuff Size: Normal)   Pulse 84   Resp 18   Ht 5' 2.75" (1.594 m)   Wt 114 lb (51.7 kg)   LMP 04/30/2008   BMI 20.36 kg/m   Weight change: @WEIGHTCHANGE @ Height:   Height: 5' 2.75" (159.4 cm)  Ht Readings from Last  3 Encounters:  04/11/20 5' 2.75" (1.594 m)  12/07/18 5' 3.5" (1.613 m)  11/01/18 5' 3.5" (1.613 m)    General appearance: alert, cooperative and appears stated age Head: Normocephalic, without obvious abnormality, atraumatic Neck: no adenopathy, supple, symmetrical, trachea midline and thyroid normal to inspection and palpation Lungs: clear to auscultation bilaterally Cardiovascular: regular rate and rhythm Breasts: normal appearance, no masses or tenderness Abdomen: soft, non-tender; non distended,  no masses,  no organomegaly Extremities: extremities normal, atraumatic, no cyanosis or edema Skin: Skin color, texture, turgor normal. No rashes or lesions Lymph nodes: Cervical, supraclavicular, and  axillary nodes normal. No abnormal inguinal nodes palpated Neurologic: Grossly normal   Pelvic: External genitalia:  no lesions              Urethra:  normal appearing urethra with no masses, tenderness or lesions              Bartholins and Skenes: normal                 Vagina: mildly atrophic appearing vagina with normal color and discharge, no lesions              Cervix: no lesions               Bimanual Exam:  Uterus:  normal size, contour, position, consistency, mobility, non-tender              Adnexa: no mass, fullness, tenderness               Rectovaginal: Confirms               Anus:  normal sphincter tone, no lesions  Terence Lux chaperoned for the exam.  1. Well woman exam No pap this year Mammogram UTD Discussed breast self exam Discussed calcium and vit D intake Flu shot today   2. Osteoporosis, unspecified osteoporosis type, unspecified pathological fracture presence Continue fosamax, started it in 9/20 - alendronate (FOSAMAX) 70 MG tablet; Take 1 tablet (70 mg total) by mouth every 7 (seven) days. Take with a full glass of water on an empty stomach.  Dispense: 12 tablet; Refill: 3 -[facility]A [facility] 8/22  3. Colon cancer screening - Ambulatory referral to Gastroenterology

## 2020-04-11 NOTE — Patient Instructions (Signed)

## 2020-09-19 ENCOUNTER — Ambulatory Visit (AMBULATORY_SURGERY_CENTER): Payer: BC Managed Care – PPO | Admitting: *Deleted

## 2020-09-19 ENCOUNTER — Other Ambulatory Visit: Payer: Self-pay

## 2020-09-19 VITALS — Ht 62.75 in | Wt 118.0 lb

## 2020-09-19 DIAGNOSIS — Z1211 Encounter for screening for malignant neoplasm of colon: Secondary | ICD-10-CM

## 2020-09-19 NOTE — Progress Notes (Signed)
Patient is here in-person for PV. Patient denies any allergies to eggs or soy. Patient denies any problems with anesthesia/sedation. Patient denies any oxygen use at home. Patient denies taking any diet/weight loss medications or blood thinners. Patient is not being treated for MRSA or C-diff. Patient is aware of our care-partner policy and [id number] safety protocol.

## 2020-10-02 ENCOUNTER — Encounter: Payer: Self-pay | Admitting: Internal Medicine

## 2020-10-03 ENCOUNTER — Other Ambulatory Visit: Payer: Self-pay

## 2020-10-03 ENCOUNTER — Encounter: Payer: Self-pay | Admitting: Internal Medicine

## 2020-10-03 ENCOUNTER — Ambulatory Visit (AMBULATORY_SURGERY_CENTER): Payer: BC Managed Care – PPO | Admitting: Internal Medicine

## 2020-10-03 VITALS — BP 143/63 | HR 57 | Temp 95.0°F | Resp 14 | Ht 62.75 in | Wt 118.0 lb

## 2020-10-03 DIAGNOSIS — Z1211 Encounter for screening for malignant neoplasm of colon: Secondary | ICD-10-CM

## 2020-10-03 DIAGNOSIS — Z860101 Personal history of adenomatous and serrated colon polyps: Secondary | ICD-10-CM

## 2020-10-03 DIAGNOSIS — Z86010 Personal history of colonic polyps: Secondary | ICD-10-CM

## 2020-10-03 DIAGNOSIS — D123 Benign neoplasm of transverse colon: Secondary | ICD-10-CM

## 2020-10-03 HISTORY — DX: Personal history of colonic polyps: Z86.010

## 2020-10-03 HISTORY — DX: Personal history of adenomatous and serrated colon polyps: Z86.0101

## 2020-10-03 MED ORDER — SODIUM CHLORIDE 0.9 % IV SOLN: 500.0000 mL | Freq: Once | INTRAVENOUS | Status: DC

## 2020-10-03 NOTE — Progress Notes (Signed)
Pt's states no medical or surgical changes since previsit or office visit. 

## 2020-10-03 NOTE — Patient Instructions (Addendum)
I found and removed one tiny polyp. All else ok.  I will let you know pathology results and when to have another routine colonoscopy by mail and/or My Chart.  I appreciate the opportunity to care for you. Gatha Mayer, MD, [facility]   Handout given for polyps.  YOU HAD AN ENDOSCOPIC PROCEDURE TODAY AT [facility]:   Refer to the procedure report that was given to you for any specific questions about what was found during the examination.  If the procedure report does not answer your questions, please call your gastroenterologist to clarify.  If you requested that your care partner not be given the details of your procedure findings, then the procedure report has been included in a sealed envelope for you to review at your convenience later.  YOU SHOULD EXPECT: Some feelings of bloating in the abdomen. Passage of more gas than usual.  Walking can help get rid of the air that was put into your GI tract during the procedure and reduce the bloating. If you had a lower endoscopy (such as a colonoscopy or flexible sigmoidoscopy) you may notice spotting of blood in your stool or on the toilet paper. If you underwent a bowel prep for your procedure, you may not have a normal bowel movement for a few days.  Please Note:  You might notice some irritation and congestion in your nose or some drainage.  This is from the oxygen used during your procedure.  There is no need for concern and it should clear up in a day or so.  SYMPTOMS TO REPORT IMMEDIATELY:  Following lower endoscopy (colonoscopy or flexible sigmoidoscopy):  Excessive amounts of blood in the stool  Significant tenderness or worsening of abdominal pains  Swelling of the abdomen that is new, acute  Fever of 100F or higher  For urgent or emergent issues, a gastroenterologist can be reached at any hour by calling [phone]. Do not use MyChart messaging for urgent concerns.    DIET:  We do recommend a small meal at  first, but then you may proceed to your regular diet.  Drink plenty of fluids but you should avoid alcoholic beverages for 24 hours.  ACTIVITY:  You should plan to take it easy for the rest of today and you should NOT DRIVE or use heavy machinery until tomorrow (because of the sedation medicines used during the test).    FOLLOW UP: Our staff will call the number listed on your records 48-72 hours following your procedure to check on you and address any questions or concerns that you may have regarding the information given to you following your procedure. If we do not reach you, we will leave a message.  We will attempt to reach you two times.  During this call, we will ask if you have developed any symptoms of COVID 19. If you develop any symptoms (ie: fever, flu-like symptoms, shortness of breath, cough etc.) before then, please call [phone].  If you test positive for Covid 19 in the 2 weeks post procedure, please call and report this information to [place].    If any biopsies were taken you will be contacted by phone or by letter within the next 1-3 weeks.  Please call us at [phone] if you have not heard about the biopsies in 3 weeks.    SIGNATURES/CONFIDENTIALITY: You and/or your care partner have signed paperwork which will be entered into your electronic medical record.  These signatures attest to the fact that  that the information above on your After Visit Summary has been reviewed and is understood.  Full responsibility of the confidentiality of this discharge information lies with you and/or your care-partner.

## 2020-10-03 NOTE — Progress Notes (Signed)
Called to room to assist during endoscopic procedure.  Patient ID and intended procedure confirmed with present staff. Received instructions for my participation in the procedure from the performing physician.  

## 2020-10-03 NOTE — Progress Notes (Signed)
A/ox3, pleased with MAC, report to RN 

## 2020-10-03 NOTE — Op Note (Signed)
[facility] Patient Name: Karen Campos Procedure Date: 10/03/2020 3:34 PM MRN: [id number] Endoscopist: Gatha Mayer , MD Age: 65 Referring MD:  Date of Birth: November 23, 1955 Gender: Female Account #: [id number] Procedure:                Colonoscopy Indications:              Screening for colorectal malignant neoplasm Medicines:                Propofol per Anesthesia, Monitored Anesthesia Care Procedure:                Pre-Anesthesia Assessment:                           - Prior to the procedure, a History and Physical                            was performed, and patient medications and                            allergies were reviewed. The patient's tolerance of                            previous anesthesia was also reviewed. The risks                            and benefits of the procedure and the sedation                            options and risks were discussed with the patient.                            All questions were answered, and informed consent                            was obtained. Prior Anticoagulants: The patient has                            taken no previous anticoagulant or antiplatelet                            agents. ASA Grade Assessment: II - A patient with                            mild systemic disease. After reviewing the risks                            and benefits, the patient was deemed in                            satisfactory condition to undergo the procedure.                           After obtaining informed consent, the colonoscope  was passed under direct vision. Throughout the                            procedure, the patient's blood pressure, pulse, and                            oxygen saturations were monitored continuously. The                            CF [id number] #[id number] was introduced through the anus                            and advanced to the the cecum, identified by                             appendiceal orifice and ileocecal valve. The                            colonoscopy was performed with moderate difficulty                            due to significant looping. Successful completion                            of the procedure was aided by straightening and                            shortening the scope to obtain bowel loop reduction                            and applying abdominal pressure. The patient                            tolerated the procedure well. The quality of the                            bowel preparation was excellent. The ileocecal                            valve, appendiceal orifice, and rectum were                            photographed. The bowel preparation used was                            Miralax via split dose instruction. Scope In: 3:40:45 PM Scope Out: 3:59:51 PM Scope Withdrawal Time: 0 hours 9 minutes 16 seconds  Total Procedure Duration: 0 hours 19 minutes 6 seconds  Findings:                 The perianal and digital rectal examinations were                            normal.  A diminutive polyp was found in the transverse                            colon. The polyp was sessile. The polyp was removed                            with a cold snare. Resection and retrieval were                            complete. Verification of patient identification                            for the specimen was done. Estimated blood loss was                            minimal.                           The exam was otherwise without abnormality on                            direct and retroflexion views. Complications:            No immediate complications. Estimated Blood Loss:     Estimated blood loss was minimal. Impression:               - One diminutive polyp in the transverse colon,                            removed with a cold snare. Resected and retrieved.                           - The examination was otherwise  normal on direct                            and retroflexion views. Recommendation:           - Patient has a contact number available for                            emergencies. The signs and symptoms of potential                            delayed complications were discussed with the                            patient. Return to normal activities tomorrow.                            Written discharge instructions were provided to the                            patient.                           - Resume previous diet.                           -  Continue present medications.                           - Repeat colonoscopy is recommended. The                            colonoscopy date will be determined after pathology                            results from today's exam become available for                            review. Gatha Mayer, MD 10/03/2020 4:05:39 PM This report has been signed electronically.

## 2020-10-07 ENCOUNTER — Telehealth: Payer: Self-pay

## 2020-10-07 NOTE — Telephone Encounter (Signed)
Left message on follow up call,.

## 2020-10-07 NOTE — Telephone Encounter (Signed)
No answer on 2nd follow up call.

## 2020-10-21 ENCOUNTER — Encounter: Payer: Self-pay | Admitting: Internal Medicine

## 2020-12-11 ENCOUNTER — Encounter: Payer: Self-pay | Admitting: Obstetrics and Gynecology

## 2020-12-13 ENCOUNTER — Encounter: Payer: Self-pay | Admitting: Obstetrics and Gynecology

## 2021-04-12 ENCOUNTER — Other Ambulatory Visit: Payer: Self-pay | Admitting: Obstetrics and Gynecology

## 2021-04-12 DIAGNOSIS — M810 Age-related osteoporosis without current pathological fracture: Secondary | ICD-10-CM

## 2021-04-14 NOTE — Telephone Encounter (Signed)
Last annual exam was 1/22 Annual exam scheduled on 08/28/21

## 2021-09-01 NOTE — Progress Notes (Signed)
66 y.o. G2P2 Married Other or two or more races Not Hispanic or Latino female here for annual exam.  No vaginal bleeding. Sexually active, no pain.   She has mixed incontinence, leaks small amounts daily. Wears a mini pad. Tolerable.   On fosamax for osteoporosis. Started in 9/20 after DEXA returned with a T score of -2.6. On calcium and vit d.   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     Patient's last menstrual period was 04/30/2008.          Sexually active: Yes.    The current method of family planning is post menopausal status.    Exercising: No.  The patient does not participate in regular exercise at present. Smoker:  no  Health Maintenance: Pap:   11/01/18 Neg:Neg HR HPV             09/30/15 Neg:Neg HR HPV History of abnormal Pap:  no MMG:  12/09/20  Bi-rads 1 neg  BMD:   12/09/20 osteopenia T score -2.3. On Fosamax since 9/20 Colonoscopy: 10/03/20 Diminutive adenoma. repeat colonoscopy 7-10 yrs TDaP:  11/04/09  Gardasil: n/a   reports that she has never smoked. She has never used smokeless tobacco. She reports that she does not drink alcohol and does not use drugs. 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 2 kids and 3 grandchildren (90, 3 and 3), everyone is local.   Past Medical History:  Diagnosis Date   Hx of adenomatous polyp of colon 10/03/2020   diminutive repeat 7-10 yrs   Hyperalbuminemia    mildy elevated - check yearly with CMP   Osteoporosis    Perimenopausal    Skin cancer    shoulder-2015   Wears glasses     Past Surgical History:  Procedure Laterality Date   COLONOSCOPY     10 years ago in [place]-normal exam per pt   TOTAL HIP ARTHROPLASTY Right 08/01/2015   Procedure: RIGHT TOTAL HIP ARTHROPLASTY ANTERIOR APPROACH;  Surgeon: Rod Can, MD;  Location: [facility];  Service: Orthopedics;  Laterality: Right;   VAGINAL DELIVERY     x2    Current Outpatient Medications  Medication Sig Dispense  Refill   alendronate (FOSAMAX) 70 MG tablet TAKE 1 TABLET EVERY 7 (SEVEN) DAYS. TAKE WITH A FULL GLASS OF WATER ON AN EMPTY STOMACH. 12 tablet 1   aspirin 81 MG tablet Take 81 mg by mouth every other day.     Black Cohosh-SoyIsoflav-Magnol (ESTROVEN MENOPAUSE RELIEF) CAPS Take 1 capsule by mouth daily.      Boswellia-Glucosamine-Vit D (OSTEO BI-FLEX ONE PER DAY) TABS See admin instructions.     Calcium Carb-Cholecalciferol 600-800 MG-UNIT TABS Take 1 tablet by mouth daily.     Lutein-Zeaxanthin 25-5 MG CAPS See admin instructions.     Melatonin 3 MG CAPS Take 1 capsule by mouth at bedtime.     Misc Natural Products (FOCUSED MIND PO) Take by mouth.     Multiple Vitamins-Minerals (CENTRUM SILVER ADULT 50+) TABS 1 tablet     Omega-3 Fatty Acids (ULTRA OMEGA-3 FISH OIL) 1400 MG CAPS Take 1 capsule by mouth daily.     No current facility-administered medications for this visit.    Family History  Problem Relation Age of Onset   Thyroid disease Mother    Glaucoma Mother    Heart disease Father    Heart disease Paternal Aunt    Breast cancer Paternal Grandmother    Colon cancer Neg  Hx    Colon polyps Neg Hx    Esophageal cancer Neg Hx    Prostate cancer Neg Hx    Rectal cancer Neg Hx     Review of Systems  All other systems reviewed and are negative.   Exam:   LMP 04/30/2008   Weight change: '@WEIGHTCHANGE'$ @ Height:      Ht Readings from Last 3 Encounters:  10/03/20 5' 2.75" (1.594 m)  09/19/20 5' 2.75" (1.594 m)  04/11/20 5' 2.75" (1.594 m)    General appearance: alert, cooperative and appears stated age Head: Normocephalic, without obvious abnormality, atraumatic Neck: no adenopathy, supple, symmetrical, trachea midline and thyroid normal to inspection and palpation Lungs: clear to auscultation bilaterally Cardiovascular: regular rate and rhythm Breasts: normal appearance, no masses or tenderness Abdomen: soft, non-tender; non distended,  no masses,  no  organomegaly Extremities: extremities normal, atraumatic, no cyanosis or edema Skin: Skin color, texture, turgor normal. No rashes or lesions Lymph nodes: Cervical, supraclavicular, and axillary nodes normal. No abnormal inguinal nodes palpated Neurologic: Grossly normal   Pelvic: External genitalia:  no lesions              Urethra:  normal appearing urethra with no masses, tenderness or lesions              Bartholins and Skenes: normal                 Vagina: normal appearing vagina with normal color and discharge, no lesions              Cervix: no lesions               Bimanual Exam:  Uterus:  normal size, contour, position, consistency, mobility, non-tender              Adnexa: no mass, fullness, tenderness               Rectovaginal: Confirms               Anus:  normal sphincter tone, no lesions  Lovena Le, CMA chaperoned for the exam.  1. Well woman exam Discussed breast self exam Mammogram in the fall Colonoscopy UTD Labs with primary No pap this year  2. Osteoporosis, unspecified osteoporosis type, unspecified pathological fracture presence -Improved on Fosamax Discussed calcium and vit D intake - alendronate (FOSAMAX) 70 MG tablet; TAKE 1 TABLET EVERY 7 (SEVEN) DAYS. TAKE WITH A FULL GLASS OF WATER ON AN EMPTY STOMACH.  Dispense: 12 tablet; Refill: 3 -DEXA due in 9/24

## 2021-09-09 ENCOUNTER — Ambulatory Visit (INDEPENDENT_AMBULATORY_CARE_PROVIDER_SITE_OTHER): Payer: BC Managed Care – PPO | Admitting: Obstetrics and Gynecology

## 2021-09-09 ENCOUNTER — Encounter: Payer: Self-pay | Admitting: Obstetrics and Gynecology

## 2021-09-09 VITALS — BP 124/72 | HR 71 | Ht 62.74 in | Wt 124.0 lb

## 2021-09-09 DIAGNOSIS — Z01419 Encounter for gynecological examination (general) (routine) without abnormal findings: Secondary | ICD-10-CM | POA: Diagnosis not present

## 2021-09-09 DIAGNOSIS — M810 Age-related osteoporosis without current pathological fracture: Secondary | ICD-10-CM | POA: Diagnosis not present

## 2021-09-09 MED ORDER — ALENDRONATE SODIUM 70 MG PO TABS: ORAL_TABLET | ORAL | 3 refills | Status: DC

## 2021-09-09 NOTE — Patient Instructions (Signed)
Kegel Exercises  Kegel exercises can help strengthen your pelvic floor muscles. The pelvic floor is a group of muscles that support your rectum, small intestine, and bladder. In females, pelvic floor muscles also help support the uterus. These muscles help you control the flow of urine and stool (feces). Kegel exercises are painless and simple. They do not require any equipment. Your provider may suggest Kegel exercises to: Improve bladder and bowel control. Improve sexual response. Improve weak pelvic floor muscles after surgery to remove the uterus (hysterectomy) or after pregnancy, in females. Improve weak pelvic floor muscles after prostate gland removal or surgery, in males. Kegel exercises involve squeezing your pelvic floor muscles. These are the same muscles you squeeze when you try to stop the flow of urine or keep from passing gas. The exercises can be done while sitting, standing, or lying down, but it is best to vary your position. Ask your health care provider which exercises are safe for you. Do exercises exactly as told by your health care provider and adjust them as directed. Do not begin these exercises until told by your health care provider. Exercises How to do Kegel exercises: Squeeze your pelvic floor muscles tight. You should feel a tight lift in your rectal area. If you are a female, you should also feel a tightness in your vaginal area. Keep your stomach, buttocks, and legs relaxed. Hold the muscles tight for up to 10 seconds. Breathe normally. Relax your muscles for up to 10 seconds. Repeat as told by your health care provider. Repeat this exercise daily as told by your health care provider. Continue to do this exercise for at least 4-6 weeks, or for as long as told by your health care provider. You may be referred to a physical therapist who can help you learn more about how to do Kegel exercises. Depending on your condition, your health care provider may  recommend: Varying how long you squeeze your muscles. Doing several sets of exercises every day. Doing exercises for several weeks. Making Kegel exercises a part of your regular exercise routine. This information is not intended to replace advice given to you by your health care provider. Make sure you discuss any questions you have with your health care provider. Document Revised: 07/25/2020 Document Reviewed: 07/25/2020 Elsevier Patient Education  2023 Elsevier Inc. EXERCISE   We recommended that you start or continue a regular exercise program for good health. Physical activity is anything that gets your body moving, some is better than none. The CDC recommends 150 minutes per week of Moderate-Intensity Aerobic Activity and 2 or more days of Muscle Strengthening Activity.  Benefits of exercise are limitless: helps weight loss/weight maintenance, improves mood and energy, helps with depression and anxiety, improves sleep, tones and strengthens muscles, improves balance, improves bone density, protects from chronic conditions such as heart disease, high blood pressure and diabetes and so much more. To learn more visit: https://www.cdc.gov/physicalactivity/index.html  DIET: Good nutrition starts with a healthy diet of fruits, vegetables, whole grains, and lean protein sources. Drink plenty of water for hydration. Minimize empty calories, sodium, sweets. For more information about dietary recommendations visit: https://health.gov/our-work/nutrition-physical-activity/dietary-guidelines and https://www.myplate.gov/  ALCOHOL:  Women should limit their alcohol intake to no more than 7 drinks/beers/glasses of wine (combined, not each!) per week. Moderation of alcohol intake to this level decreases your risk of breast cancer and liver damage.  If you are concerned that you may have a problem, or your friends have told you they are concerned about your   drinking, there are many resources to help. A well-known  program that is free, effective, and available to all people all over the nation is Alcoholics Anonymous.  Check out this site to learn more: https://www.aa.org/   CALCIUM AND VITAMIN D:  Adequate intake of calcium and Vitamin D are recommended for bone health.  You should be getting between 1000-1200 mg of calcium and 800 units of Vitamin D daily between diet and supplements  PAP SMEARS:  Pap smears, to check for cervical cancer or precancers,  have traditionally been done yearly, scientific advances have shown that most women can have pap smears less often.  However, every woman still should have a physical exam from her gynecologist every year. It will include a breast check, inspection of the vulva and vagina to check for abnormal growths or skin changes, a visual exam of the cervix, and then an exam to evaluate the size and shape of the uterus and ovaries. We will also provide age appropriate advice regarding health maintenance, like when you should have certain vaccines, screening for sexually transmitted diseases, bone density testing, colonoscopy, mammograms, etc.   MAMMOGRAMS:  All women over 40 years old should have a routine mammogram.   COLON CANCER SCREENING: Now recommend starting at age 45. At this time colonoscopy is not covered for routine screening until 50. There are take home tests that can be done between 45-49.   COLONOSCOPY:  Colonoscopy to screen for colon cancer is recommended for all women at age 50.  We know, you hate the idea of the prep.  We agree, BUT, having colon cancer and not knowing it is worse!!  Colon cancer so often starts as a polyp that can be seen and removed at colonscopy, which can quite literally save your life!  And if your first colonoscopy is normal and you have no family history of colon cancer, most women don't have to have it again for 10 years.  Once every ten years, you can do something that may end up saving your life, right?  We will be happy to help  you get it scheduled when you are ready.  Be sure to check your insurance coverage so you understand how much it will cost.  It may be covered as a preventative service at no cost, but you should check your particular policy.      Breast Self-Awareness Breast self-awareness means being familiar with how your breasts look and feel. It involves checking your breasts regularly and reporting any changes to your health care provider. Practicing breast self-awareness is important. A change in your breasts can be a sign of a serious medical problem. Being familiar with how your breasts look and feel allows you to find any problems early, when treatment is more likely to be successful. All women should practice breast self-awareness, including women who have had breast implants. How to do a breast self-exam One way to learn what is normal for your breasts and whether your breasts are changing is to do a breast self-exam. To do a breast self-exam: Look for Changes  Remove all the clothing above your waist. Stand in front of a mirror in a room with good lighting. Put your hands on your hips. Push your hands firmly downward. Compare your breasts in the mirror. Look for differences between them (asymmetry), such as: Differences in shape. Differences in size. Puckers, dips, and bumps in one breast and not the other. Look at each breast for changes in your skin, such   as: Redness. Scaly areas. Look for changes in your nipples, such as: Discharge. Bleeding. Dimpling. Redness. A change in position. Feel for Changes Carefully feel your breasts for lumps and changes. It is best to do this while lying on your back on the floor and again while sitting or standing in the shower or tub with soapy water on your skin. Feel each breast in the following way: Place the arm on the side of the breast you are examining above your head. Feel your breast with the other hand. Start in the nipple area and make  inch  (2 cm) overlapping circles to feel your breast. Use the pads of your three middle fingers to do this. Apply light pressure, then medium pressure, then firm pressure. The light pressure will allow you to feel the tissue closest to the skin. The medium pressure will allow you to feel the tissue that is a little deeper. The firm pressure will allow you to feel the tissue close to the ribs. Continue the overlapping circles, moving downward over the breast until you feel your ribs below your breast. Move one finger-width toward the center of the body. Continue to use the  inch (2 cm) overlapping circles to feel your breast as you move slowly up toward your collarbone. Continue the up and down exam using all three pressures until you reach your armpit.  Write Down What You Find  Write down what is normal for each breast and any changes that you find. Keep a written record with breast changes or normal findings for each breast. By writing this information down, you do not need to depend only on memory for size, tenderness, or location. Write down where you are in your menstrual cycle, if you are still menstruating. If you are having trouble noticing differences in your breasts, do not get discouraged. With time you will become more familiar with the variations in your breasts and more comfortable with the exam. How often should I examine my breasts? Examine your breasts every month. If you are breastfeeding, the best time to examine your breasts is after a feeding or after using a breast pump. If you menstruate, the best time to examine your breasts is 5-7 days after your period is over. During your period, your breasts are lumpier, and it may be more difficult to notice changes. When should I see my health care provider? See your health care provider if you notice: A change in shape or size of your breasts or nipples. A change in the skin of your breast or nipples, such as a reddened or scaly area. Unusual  discharge from your nipples. A lump or thick area that was not there before. Pain in your breasts. Anything that concerns you.  

## 2021-10-02 ENCOUNTER — Telehealth: Payer: Self-pay

## 2021-10-02 ENCOUNTER — Other Ambulatory Visit: Payer: Self-pay | Admitting: Obstetrics and Gynecology

## 2021-10-02 DIAGNOSIS — M810 Age-related osteoporosis without current pathological fracture: Secondary | ICD-10-CM

## 2021-10-02 NOTE — Telephone Encounter (Signed)
Patient called in voice mail requesting refill on Alendronate. I called patient back and let her know Rx was sent on 09/09/21.  I checked with her pharmacy and they do have the Rx and is filling it for her now. Patient informed.

## 2021-10-02 NOTE — Telephone Encounter (Signed)
FYI. Refusing refill request due to Rx being sent on 09/09/21 for #12 with 3 refills. Will note that to pharmacy as well so they can take a second look.

## 2021-12-17 ENCOUNTER — Encounter: Payer: Self-pay | Admitting: Obstetrics and Gynecology

## 2022-09-12 ENCOUNTER — Other Ambulatory Visit: Payer: Self-pay | Admitting: Obstetrics and Gynecology

## 2022-09-12 DIAGNOSIS — M810 Age-related osteoporosis without current pathological fracture: Secondary | ICD-10-CM

## 2022-09-14 ENCOUNTER — Ambulatory Visit: Payer: BC Managed Care – PPO | Admitting: Obstetrics and Gynecology

## 2022-09-14 NOTE — Telephone Encounter (Signed)
Medication refill request: fosamax  Last AEX:  09/09/21 Next AEX: 09/23/22 Last MMG (if hormonal medication request): n/a  Refill authorized: none given will refill at appointment next week

## 2022-09-14 NOTE — Progress Notes (Signed)
67 y.o. G2P2 Married Other or two or more races Not Hispanic or Latino female here for annual exam.  No vaginal bleeding. No dyspareunia.  On fosamax for osteoporosis. Started in 9/20 after DEXA returned with a T score of -2.6. On calcium and vit d.    She has mixed incontinence, stable, tolerable. Wears a panty liner. Leaks small amounts.   Patient's last menstrual period was 04/30/2008.          Sexually active: Yes.    The current method of family planning is post menopausal status.    Exercising: No.  The patient does not participate in regular exercise at present. Smoker:  no  Health Maintenance: Pap: 11/01/18 Neg:Neg HR HPV             09/30/15 Neg:Neg HR HPV History of abnormal Pap:  no MMG:  12/15/21 Bi-rads 1 neg  BMD:   12/09/21 Osteopenia  Colonoscopy:10/03/20 Diminutive adenoma. repeat colonoscopy 7-10 yrs TDaP:  11/04/09, she thinks UTD with her primary. Will check.  Gardasil: n/a   reports that she has never smoked. She has never used smokeless tobacco. She reports that she does not drink alcohol and does not use drugs. Kindergarten Runner, broadcasting/film/video, still enjoying work. Husband is retired. 2 kids and 3 grandchildren, all local. Her 54 year old mother lives with her.     Past Medical History:  Diagnosis Date   Hx of adenomatous polyp of colon 10/03/2020   diminutive repeat 7-10 yrs   Hyperalbuminemia    mildy elevated - check yearly with CMP   Osteoporosis    Perimenopausal    Skin cancer    shoulder-2015   Wears glasses     Past Surgical History:  Procedure Laterality Date   COLONOSCOPY     10 years ago in [place]-normal exam per pt   TOTAL HIP ARTHROPLASTY Right 08/01/2015   Procedure: RIGHT TOTAL HIP ARTHROPLASTY ANTERIOR APPROACH;  Surgeon: Samson Frederic, MD;  Location: [facility];  Service: Orthopedics;  Laterality: Right;   VAGINAL DELIVERY     x2    Current Outpatient Medications  Medication Sig Dispense Refill   alendronate (FOSAMAX) 70 MG tablet TAKE 1 TABLET  EVERY 7 (SEVEN) DAYS. TAKE WITH A FULL GLASS OF WATER ON AN EMPTY STOMACH. 12 tablet 3   aspirin 81 MG tablet Take 81 mg by mouth every other day.     Black Cohosh-SoyIsoflav-Magnol (ESTROVEN MENOPAUSE RELIEF) CAPS Take 1 capsule by mouth daily.      Boswellia-Glucosamine-Vit D (OSTEO BI-FLEX ONE PER DAY) TABS See admin instructions.     Calcium Carb-Cholecalciferol 600-800 MG-UNIT TABS Take 1 tablet by mouth daily.     Lutein-Zeaxanthin 25-5 MG CAPS See admin instructions.     Melatonin 3 MG CAPS Take 1 capsule by mouth at bedtime.     Misc Natural Products (FOCUSED MIND PO) Take by mouth.     Multiple Vitamins-Minerals (CENTRUM SILVER ADULT 50+) TABS 1 tablet     Omega-3 Fatty Acids (ULTRA OMEGA-3 FISH OIL) 1400 MG CAPS Take 1 capsule by mouth daily.     No current facility-administered medications for this visit.    Family History  Problem Relation Age of Onset   Thyroid disease Mother    Glaucoma Mother    Heart disease Father    Heart disease Paternal Aunt    Breast cancer Paternal Grandmother    Colon cancer Neg Hx    Colon polyps Neg Hx    Esophageal cancer Neg Hx  Prostate cancer Neg Hx    Rectal cancer Neg Hx     Review of Systems  All other systems reviewed and are negative.   Exam:   LMP 04/30/2008   Weight change: @WEIGHTCHANGE @ Height:      Ht Readings from Last 3 Encounters:  09/09/21 5' 2.74" (1.594 m)  10/03/20 5' 2.75" (1.594 m)  09/19/20 5' 2.75" (1.594 m)    General appearance: alert, cooperative and appears stated age Head: Normocephalic, without obvious abnormality, atraumatic Neck: no adenopathy, supple, symmetrical, trachea midline and thyroid normal to inspection and palpation Lungs: clear to auscultation bilaterally Cardiovascular: regular rate and rhythm Breasts: normal appearance, no masses or tenderness Abdomen: soft, non-tender; non distended,  no masses,  no organomegaly Extremities: extremities normal, atraumatic, no cyanosis or  edema Skin: Skin color, texture, turgor normal. No rashes or lesions Lymph nodes: Cervical, supraclavicular, and axillary nodes normal. No abnormal inguinal nodes palpated Neurologic: Grossly normal   Pelvic: External genitalia:  no lesions              Urethra:  normal appearing urethra with no masses, tenderness or lesions              Bartholins and Skenes: normal                 Vagina: mildly atrophic appearing vagina with normal color and discharge, no lesions              Cervix: no lesions               Bimanual Exam:  Uterus:  normal size, contour, position, consistency, mobility, non-tender              Adnexa: no mass, fullness, tenderness               Rectovaginal: Confirms               Anus:  normal sphincter tone, no lesions  Carolynn Serve, CMA chaperoned for the exam.  1. Well woman exam Discussed breast self exam Discussed calcium and vit D intake Labs with primary Mammogram and colonoscopy UTD Pap UTD  2. History of osteoporosis DEXA due in 9/24, script sent to [facility].  - alendronate (FOSAMAX) 70 MG tablet; TAKE 1 TABLET EVERY 7 (SEVEN) DAYS. TAKE WITH A FULL GLASS OF WATER ON AN EMPTY STOMACH.  Dispense: 12 tablet; Refill: 3

## 2022-09-23 ENCOUNTER — Ambulatory Visit (INDEPENDENT_AMBULATORY_CARE_PROVIDER_SITE_OTHER): Payer: BC Managed Care – PPO | Admitting: Obstetrics and Gynecology

## 2022-09-23 ENCOUNTER — Encounter: Payer: Self-pay | Admitting: Obstetrics and Gynecology

## 2022-09-23 VITALS — BP 110/68 | HR 68 | Wt 123.0 lb

## 2022-09-23 DIAGNOSIS — Z8739 Personal history of other diseases of the musculoskeletal system and connective tissue: Secondary | ICD-10-CM

## 2022-09-23 DIAGNOSIS — Z01419 Encounter for gynecological examination (general) (routine) without abnormal findings: Secondary | ICD-10-CM

## 2022-09-23 MED ORDER — ALENDRONATE SODIUM 70 MG PO TABS: ORAL_TABLET | ORAL | 3 refills | Status: DC

## 2022-09-23 NOTE — Patient Instructions (Signed)

## 2023-01-26 ENCOUNTER — Ambulatory Visit: Payer: Self-pay | Admitting: Student

## 2023-01-26 NOTE — Progress Notes (Signed)
Sent message, via epic in basket, requesting orders in epic from surgeon.  

## 2023-01-27 NOTE — Progress Notes (Addendum)
COVID Vaccine received:  [x]  No []  Yes Date of any COVID positive Test in last 90 days: no PCP - Deatra James MD Cardiologist - no  Chest x-ray -  EKG -   Stress Test -  ECHO -  Cardiac Cath -   Medical and cardiac clearance 01/05/23 Dr Deatra James  Bowel Prep - [x]  No  []   Yes ______  Pacemaker / ICD device [x]  No []  Yes   Spinal Cord Stimulator:[x]  No []  Yes       History of Sleep Apnea? [x]  No []  Yes   CPAP used?- [x]  No []  Yes    Does the patient monitor blood sugar?          [x]  No []  Yes  []  N/A  Patient has: [x]  NO Hx DM   []  Pre-DM                 []  DM1  []   DM2 Does patient have a [employer] or Dexacom? []  No []  Yes   Fasting Blood Sugar Ranges-  Checks Blood Sugar _____ times a day  GLP1 agonist / usual dose - no GLP1 instructions:  SGLT-2 inhibitors / usual dose - no SGLT-2 instructions:   Blood Thinner / Instructions:no Aspirin Instructions:81mg  ASA every other day. Will stop  7 days prior to surgery  Comments:   Activity level: Patient is able to climb a flight of stairs without difficulty; [x]  No CP  [x]  No SOB, ___   Patient canperform ADLs without assistance.   Anesthesia review:   Patient denies shortness of breath, fever, cough and chest pain at PAT appointment.  Patient verbalized understanding and agreement to the Pre-Surgical Instructions that were given to them at this PAT appointment. Patient was also educated of the need to review these PAT instructions again prior to his/her surgery.I reviewed the appropriate phone numbers to call if they have any and questions or concerns.

## 2023-01-27 NOTE — Patient Instructions (Addendum)
SURGICAL WAITING ROOM VISITATION  Patients having surgery or a procedure may have no more than 2 support people in the waiting area - these visitors may rotate.    Children under the age of 73 must have an adult with them who is not the patient.  Due to an increase in RSV and influenza rates and associated hospitalizations, children ages 3 and under may not visit patients in [facility] hospitals.  If the patient needs to stay at the hospital during part of their recovery, the visitor guidelines for inpatient rooms apply. Pre-op nurse will coordinate an appropriate time for 1 support person to accompany patient in pre-op.  This support person may not rotate.    Please refer to the [facility] website for the visitor guidelines for Inpatients (after your surgery is over and you are in a regular room).       Your procedure is scheduled on: 02/11/23   Report to [facility] Main Entrance    Report to admitting at 6:45 AM   Call this number if you have problems the morning of surgery [phone]   Do not eat food :After Midnight.   After Midnight you may have the following liquids until 6:15 AM DAY OF SURGERY  Water Non-Citrus Juices (without pulp, NO RED-Apple, White grape, White cranberry) Black Coffee (NO MILK/CREAM OR CREAMERS, sugar ok)  Clear Tea (NO MILK/CREAM OR CREAMERS, sugar ok) regular and decaf                             Plain Jell-O (NO RED)                                           Fruit ices (not with fruit pulp, NO RED)                                     Popsicles (NO RED)                                                               Sports drinks like Gatorade (NO RED)                The day of surgery:  Drink ONE (1) Pre-Surgery Clear Ensure at 6:15 AM the morning of surgery. Drink in one sitting. Do not sip.  This drink was given to you during your hospital  pre-op appointment visit. Nothing else to drink after completing the  Pre-Surgery Clear  Ensure      Oral Hygiene is also important to reduce your risk of infection.                                    Remember - BRUSH YOUR TEETH THE MORNING OF SURGERY WITH YOUR REGULAR TOOTHPASTE   Stop all vitamins and herbal supplements 7 days before surgery.   Take these medicines the morning of surgery with A SIP OF WATER: none  You may not have any metal on your body including hair pins, jewelry, and body piercing             Do not wear make-up, lotions, powders, perfumes/cologne, or deodorant  Do not wear nail polish including gel and S&S, artificial/acrylic nails, or any other type of covering on natural nails including finger and toenails. If you have artificial nails, gel coating, etc. that needs to be removed by a nail salon please have this removed prior to surgery or surgery may need to be canceled/ delayed if the surgeon/ anesthesia feels like they are unable to be safely monitored.   Do not shave  48 hours prior to surgery.    Do not bring valuables to the hospital. [facility] IS NOT             RESPONSIBLE   FOR VALUABLES.   Contacts, glasses, dentures or bridgework may not be worn into surgery.  DO NOT BRING YOUR HOME MEDICATIONS TO THE HOSPITAL. PHARMACY WILL DISPENSE MEDICATIONS LISTED ON YOUR MEDICATION LIST TO YOU DURING YOUR ADMISSION IN THE HOSPITAL!    Patients discharged on the day of surgery will not be allowed to drive home.  Someone NEEDS to stay with you for the first 24 hours after anesthesia.   Special Instructions: Bring a copy of your healthcare power of attorney and living will documents the day of surgery if you haven't scanned them before.              Please read over the following fact sheets you were given: IF YOU HAVE QUESTIONS ABOUT YOUR PRE-OP INSTRUCTIONS PLEASE CALL [phone] Rosey [facility]   If you received a COVID test during your pre-op visit  it is requested that you wear a mask when out in public, stay away from anyone that may not  be feeling well and notify your surgeon if you develop symptoms. If you test positive for Covid or have been in contact with anyone that has tested positive in the last 10 days please notify you surgeon.      Pre-operative 5 CHG Bath Instructions   You can play a key role in reducing the risk of infection after surgery. Your skin needs to be as free of germs as possible. You can reduce the number of germs on your skin by washing with CHG (chlorhexidine gluconate) soap before surgery. CHG is an antiseptic soap that kills germs and continues to kill germs even after washing.   DO NOT use if you have an allergy to chlorhexidine/CHG or antibacterial soaps. If your skin becomes reddened or irritated, stop using the CHG and notify one of our RNs at [phone].   Please shower with the CHG soap starting 4 days before surgery using the following schedule:     Please keep in mind the following:  DO NOT shave, including legs and underarms, starting the day of your first shower.   You may shave your face at any point before/day of surgery.  Place clean sheets on your bed the day you start using CHG soap. Use a clean washcloth (not used since being washed) for each shower. DO NOT sleep with pets once you start using the CHG.   CHG Shower Instructions:  If you choose to wash your hair and private area, wash first with your normal shampoo/soap.  After you use shampoo/soap, rinse your hair and body thoroughly to remove shampoo/soap residue.  Turn the water OFF and apply about 3 tablespoons (45 ml)  of CHG soap to a CLEAN washcloth.  Apply CHG soap ONLY FROM YOUR NECK DOWN TO YOUR TOES (washing for 3-5 minutes)  DO NOT use CHG soap on face, private areas, open wounds, or sores.  Pay special attention to the area where your surgery is being performed.  If you are having back surgery, having someone wash your back for you may be helpful. Wait 2 minutes after CHG soap is applied, then you may rinse off  the CHG soap.  Pat dry with a clean towel  Put on clean clothes/pajamas   If you choose to wear lotion, please use ONLY the CHG-compatible lotions on the back of this paper.     Additional instructions for the day of surgery: DO NOT APPLY any lotions, deodorants, cologne, or perfumes.   Put on clean/comfortable clothes.  Brush your teeth.  Ask your nurse before applying any prescription medications to the skin.      CHG Compatible Lotions   Aveeno Moisturizing lotion  Cetaphil Moisturizing Cream  Cetaphil Moisturizing Lotion  Clairol Herbal Essence Moisturizing Lotion, Dry Skin  Clairol Herbal Essence Moisturizing Lotion, Extra Dry Skin  Clairol Herbal Essence Moisturizing Lotion, Normal Skin  Curel Age Defying Therapeutic Moisturizing Lotion with Alpha Hydroxy  Curel Extreme Care Body Lotion  Curel Soothing Hands Moisturizing Hand Lotion  Curel Therapeutic Moisturizing Cream, Fragrance-Free  Curel Therapeutic Moisturizing Lotion, Fragrance-Free  Curel Therapeutic Moisturizing Lotion, Original Formula  Eucerin Daily Replenishing Lotion  Eucerin Dry Skin Therapy Plus Alpha Hydroxy Crme  Eucerin Dry Skin Therapy Plus Alpha Hydroxy Lotion  Eucerin Original Crme  Eucerin Original Lotion  Eucerin Plus Crme Eucerin Plus Lotion  Eucerin TriLipid Replenishing Lotion  Keri Anti-Bacterial Hand Lotion  Keri Deep Conditioning Original Lotion Dry Skin Formula Softly Scented  Keri Deep Conditioning Original Lotion, Fragrance Free Sensitive Skin Formula  Keri Lotion Fast Absorbing Fragrance Free Sensitive Skin Formula  Keri Lotion Fast Absorbing Softly Scented Dry Skin Formula  Keri Original Lotion  Keri Skin Renewal Lotion Keri Silky Smooth Lotion  Keri Silky Smooth Sensitive Skin Lotion  Nivea Body Creamy Conditioning Oil  Nivea Body Extra Enriched Lotion  Nivea Body Original Lotion  Nivea Body Sheer Moisturizing Lotion Nivea Crme  Nivea Skin Firming Lotion  NutraDerm 30 Skin  Lotion  NutraDerm Skin Lotion  NutraDerm Therapeutic Skin Cream  NutraDerm Therapeutic Skin Lotion  ProShield Protective Hand Cream   Incentive Spirometer (Watch this video at home: [email or web address])  An incentive spirometer is a tool that can help keep your lungs clear and active. This tool measures how well you are filling your lungs with each breath. Taking long deep breaths may help reverse or decrease the chance of developing breathing (pulmonary) problems (especially infection) following: A long period of time when you are unable to move or be active. BEFORE THE PROCEDURE  If the spirometer includes an indicator to show your best effort, your nurse or respiratory therapist will set it to a desired goal. If possible, sit up straight or lean slightly forward. Try not to slouch. Hold the incentive spirometer in an upright position. INSTRUCTIONS FOR USE  Sit on the edge of your bed if possible, or sit up as far as you can in bed or on a chair. Hold the incentive spirometer in an upright position. Breathe out normally. Place the mouthpiece in your mouth and seal your lips tightly around it. Breathe in slowly and as deeply as possible, raising the piston or the ball toward the top of  the column. Hold your breath for 3-5 seconds or for as long as possible. Allow the piston or ball to fall to the bottom of the column. Remove the mouthpiece from your mouth and breathe out normally. Rest for a few seconds and repeat Steps 1 through 7 at least 10 times every 1-2 hours when you are awake. Take your time and take a few normal breaths between deep breaths. The spirometer may include an indicator to show your best effort. Use the indicator as a goal to work toward during each repetition. After each set of 10 deep breaths, practice coughing to be sure your lungs are clear. If you have an incision (the cut made at the time of surgery), support your incision when coughing by  placing a pillow or rolled up towels firmly against it. Once you are able to get out of bed, walk around indoors and cough well. You may stop using the incentive spirometer when instructed by your caregiver.  RISKS AND COMPLICATIONS Take your time so you do not get dizzy or light-headed. If you are in pain, you may need to take or ask for pain medication before doing incentive spirometry. It is harder to take a deep breath if you are having pain. AFTER USE Rest and breathe slowly and easily. It can be helpful to keep track of a log of your progress. Your caregiver can provide you with a simple table to help with this. If you are using the spirometer at home, follow these instructions: SEEK MEDICAL CARE IF:  You are having difficultly using the spirometer. You have trouble using the spirometer as often as instructed. Your pain medication is not giving enough relief while using the spirometer. You develop fever of 100.5 F (38.1 C) or higher. SEEK IMMEDIATE MEDICAL CARE IF:  You cough up bloody sputum that had not been present before. You develop fever of 102 F (38.9 C) or greater. You develop worsening pain at or near the incision site. MAKE SURE YOU:  Understand these instructions. Will watch your condition. Will get help right away if you are not doing well or get worse. Document Released: 07/27/2006 Document Revised: 06/08/2011 Document Reviewed: 09/27/2006 [facility] Patient Information 2014 [place], [place].  WHAT IS A BLOOD TRANSFUSION? Blood Transfusion Information  A transfusion is the replacement of blood or some of its parts. Blood is made up of multiple cells which provide different functions. Red blood cells carry oxygen and are used for blood loss replacement. White blood cells fight against infection. Platelets control bleeding. Plasma helps clot blood. Other blood products are available for specialized needs, such as hemophilia or other clotting disorders. BEFORE THE  TRANSFUSION  Who gives blood for transfusions?  Healthy volunteers who are fully evaluated to make sure their blood is safe. This is blood bank blood. Transfusion therapy is the safest it has ever been in the practice of medicine. Before blood is taken from a donor, a complete history is taken to make sure that person has no history of diseases nor engages in risky social behavior (examples are intravenous drug use or sexual activity with multiple partners). The donor's travel history is screened to minimize risk of transmitting infections, such as malaria. The donated blood is tested for signs of infectious diseases, such as HIV and hepatitis. The blood is then tested to be sure it is compatible with you in order to minimize the chance of a transfusion reaction. If you or a relative donates blood, this is often done in  anticipation of surgery and is not appropriate for emergency situations. It takes many days to process the donated blood. RISKS AND COMPLICATIONS Although transfusion therapy is very safe and saves many lives, the main dangers of transfusion include:  Getting an infectious disease. Developing a transfusion reaction. This is an allergic reaction to something in the blood you were given. Every precaution is taken to prevent this. The decision to have a blood transfusion has been considered carefully by your caregiver before blood is given. Blood is not given unless the benefits outweigh the risks. AFTER THE TRANSFUSION Right after receiving a blood transfusion, you will usually feel much better and more energetic. This is especially true if your red blood cells have gotten low (anemic). The transfusion raises the level of the red blood cells which carry oxygen, and this usually causes an energy increase. The nurse administering the transfusion will monitor you carefully for complications. HOME CARE INSTRUCTIONS  No special instructions are needed after a transfusion. You may find your  energy is better. Speak with your caregiver about any limitations on activity for underlying diseases you may have. SEEK MEDICAL CARE IF:  Your condition is not improving after your transfusion. You develop redness or irritation at the intravenous (IV) site. SEEK IMMEDIATE MEDICAL CARE IF:  Any of the following symptoms occur over the next 12 hours: Shaking chills. You have a temperature by mouth above 102 F (38.9 C), not controlled by medicine. Chest, back, or muscle pain. People around you feel you are not acting correctly or are confused. Shortness of breath or difficulty breathing. Dizziness and fainting. You get a rash or develop hives. You have a decrease in urine output. Your urine turns a dark color or changes to pink, red, or brown. Any of the following symptoms occur over the next 10 days: You have a temperature by mouth above 102 F (38.9 C), not controlled by medicine. Shortness of breath. Weakness after normal activity. The white part of the eye turns yellow (jaundice). You have a decrease in the amount of urine or are urinating less often. Your urine turns a dark color or changes to pink, red, or brown. Document Released: 03/13/2000 Document Revised: 06/08/2011 Document Reviewed: 10/31/2007 [facility] Patient Information 2014 [place], [place].

## 2023-01-28 ENCOUNTER — Encounter: Payer: Self-pay | Admitting: Radiology

## 2023-01-28 NOTE — Progress Notes (Signed)
Please send to TW- scheduled with her for AEX

## 2023-02-01 NOTE — Progress Notes (Signed)
"  I spoke to patient to give your recommendations. She states that she is preparing to have a hip replacement and was told to stop taking the Fosamax for one year. She agrees to continue to come to see you annually and will repeat DXA in 2 years." -Tonita Phoenix, CMA

## 2023-02-02 ENCOUNTER — Other Ambulatory Visit: Payer: Self-pay

## 2023-02-02 ENCOUNTER — Encounter (HOSPITAL_COMMUNITY)
Admission: RE | Admit: 2023-02-02 | Discharge: 2023-02-02 | Disposition: A | Discharge status: Home / Self Care | Payer: BC Managed Care – PPO | Source: Ambulatory Visit | Attending: Orthopedic Surgery | Admitting: Orthopedic Surgery

## 2023-02-02 ENCOUNTER — Encounter (HOSPITAL_COMMUNITY): Payer: Self-pay

## 2023-02-02 VITALS — BP 153/74 | HR 92 | Temp 98.5°F | Resp 16 | Ht 63.0 in | Wt 126.0 lb

## 2023-02-02 DIAGNOSIS — Z01812 Encounter for preprocedural laboratory examination: Secondary | ICD-10-CM | POA: Diagnosis present

## 2023-02-02 DIAGNOSIS — Z01818 Encounter for other preprocedural examination: Secondary | ICD-10-CM

## 2023-02-02 HISTORY — DX: Unspecified osteoarthritis, unspecified site: M19.90

## 2023-02-02 LAB — CBC
HCT: 42.4 % (ref 36.0–46.0)
Hemoglobin: 14.6 g/dL (ref 12.0–15.0)
MCH: 32.2 pg (ref 26.0–34.0)
MCHC: 34.4 g/dL (ref 30.0–36.0)
MCV: 93.4 fL (ref 80.0–100.0)
Platelets: 206 10*3/uL (ref 150–400)
RBC: 4.54 MIL/uL (ref 3.87–5.11)
RDW: 12 % (ref 11.5–15.5)
WBC: 4.2 10*3/uL (ref 4.0–10.5)
nRBC: 0 % (ref 0.0–0.2)

## 2023-02-02 LAB — SURGICAL PCR SCREEN
MRSA, PCR: NEGATIVE
Staphylococcus aureus: NEGATIVE

## 2023-02-09 ENCOUNTER — Ambulatory Visit: Payer: Self-pay | Admitting: Student

## 2023-02-09 NOTE — H&P (View-Only) (Signed)
TOTAL HIP ADMISSION H&P  Patient is admitted for left total hip arthroplasty.  Subjective:  Chief Complaint: left hip pain  HPI: Karen Campos, 67 y.o. female, has a history of pain and functional disability in the left hip(s) due to arthritis and patient has failed non-surgical conservative treatments for greater than 12 weeks to include NSAID's and/or analgesics, flexibility and strengthening excercises, and activity modification.  Onset of symptoms was gradual starting 10 years ago with rapidlly worsening course since that time.The patient noted no past surgery on the left hip(s).  Patient currently rates pain in the left hip at 10 out of 10 with activity. Patient has night pain, worsening of pain with activity and weight bearing, trendelenberg gait, pain that interfers with activities of daily living, and pain with passive range of motion. Patient has evidence of subchondral cysts, subchondral sclerosis, periarticular osteophytes, and joint space narrowing by imaging studies. This condition presents safety issues increasing the risk of falls.  There is no current active infection.  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   Primary osteoarthritis of right hip 08/01/2015   Past Medical History:  Diagnosis Date   Arthritis    Hx of adenomatous polyp of colon 10/03/2020   diminutive repeat 7-10 yrs   Hyperalbuminemia    mildy elevated - check yearly with CMP   Osteoporosis    Perimenopausal    Skin cancer    shoulder-2015   Wears glasses     Past Surgical History:  Procedure Laterality Date   COLONOSCOPY     10 years ago in [place]-normal exam per pt   COLONOSCOPY     TOTAL HIP ARTHROPLASTY Right 08/01/2015   Procedure: RIGHT TOTAL HIP ARTHROPLASTY ANTERIOR APPROACH;  Surgeon: Samson Frederic, MD;  Location: [facility];  Service: Orthopedics;   Laterality: Right;   VAGINAL DELIVERY     x2    Current Outpatient Medications  Medication Sig Dispense Refill Last Dose   alendronate (FOSAMAX) 70 MG tablet TAKE 1 TABLET EVERY 7 (SEVEN) DAYS. TAKE WITH A FULL GLASS OF WATER ON AN EMPTY STOMACH. 12 tablet 3    aspirin 81 MG tablet Take 81 mg by mouth every other day.      Black Cohosh-SoyIsoflav-Magnol (ESTROVEN MENOPAUSE RELIEF) CAPS Take 1 capsule by mouth daily.       Boswellia-Glucosamine-Vit D (OSTEO BI-FLEX ONE PER DAY) TABS Take 1 tablet by mouth 2 (two) times daily.      Calcium Citrate-Vitamin D (CALCIUM + D PO) Take 2 tablets by mouth at bedtime.      LUTEIN PO Take 1 capsule by mouth daily.      melatonin 5 MG TABS Take 5 mg by mouth at bedtime as needed (sleep).      Multiple Vitamins-Minerals (CENTRUM SILVER ADULT 50+) TABS Take 1 tablet by mouth daily.      mupirocin ointment (BACTROBAN) 2 % Apply 1 Application topically at bedtime.      Omega-3 Fatty Acids (OMEGA 3 500 PO) Take 1,000 mg by mouth daily.      No current facility-administered medications for this visit.   Allergies  Allergen Reactions   Penicillins Hives         Social History   Tobacco Use   Smoking status: Never   Smokeless tobacco: Never  Substance Use Topics   Alcohol use: No  Alcohol/week: 0.0 standard drinks of alcohol    Family History  Problem Relation Age of Onset   Thyroid disease Mother    Glaucoma Mother    Heart disease Father    Heart disease Paternal Aunt    Breast cancer Paternal Grandmother    Colon cancer Neg Hx    Colon polyps Neg Hx    Esophageal cancer Neg Hx    Prostate cancer Neg Hx    Rectal cancer Neg Hx      Review of Systems  Musculoskeletal:  Positive for arthralgias and gait problem.  All other systems reviewed and are negative.   Objective:  Physical Exam Constitutional:      Appearance: Normal appearance.  HENT:     Head: Normocephalic and atraumatic.     Nose: Nose normal.     Mouth/Throat:      Mouth: Mucous membranes are moist.     Pharynx: Oropharynx is clear.  Eyes:     Conjunctiva/sclera: Conjunctivae normal.  Cardiovascular:     Rate and Rhythm: Normal rate and regular rhythm.     Pulses: Normal pulses.     Heart sounds: Normal heart sounds.  Pulmonary:     Effort: Pulmonary effort is normal.     Breath sounds: Normal breath sounds.  Abdominal:     General: Abdomen is flat.     Palpations: Abdomen is soft.  Genitourinary:    Comments: Deferred. Musculoskeletal:     Cervical back: Normal range of motion and neck supple.     Comments: Examination of the left hip reveals no skin wounds or lesions. She has restricted range of motion of the hip. Pain with flexion and rotation of the hip. Pain in the position of impingement. Positive Stinchfield. Mild trochanteric tenderness to palpation. Hip abductor strength 4/5.  Skin:    General: Skin is warm and dry.     Capillary Refill: Capillary refill takes less than 2 seconds.  Neurological:     General: No focal deficit present.     Mental Status: She is alert and oriented to person, place, and time.  Psychiatric:        Mood and Affect: Mood normal.        Behavior: Behavior normal.        Thought Content: Thought content normal.        Judgment: Judgment normal.     Vital signs in last 24 hours: @VSRANGES @  Labs:   Estimated body mass index is 22.32 kg/m as calculated from the following:   Height as of 02/02/23: 5\' 3"  (1.6 m).   Weight as of 02/02/23: 57.2 kg.   Imaging Review Plain radiographs demonstrate severe degenerative joint disease of the left hip(s). The bone quality appears to be adequate for age and reported activity level.      Assessment/Plan:  End stage arthritis, left hip(s)  The patient history, physical examination, clinical judgement of the provider and imaging studies are consistent with end stage degenerative joint disease of the left hip(s) and total hip arthroplasty is deemed  medically necessary. The treatment options including medical management, injection therapy, arthroscopy and arthroplasty were discussed at length. The risks and benefits of total hip arthroplasty were presented and reviewed. The risks due to aseptic loosening, infection, stiffness, dislocation/subluxation,  thromboembolic complications and other imponderables were discussed.  The patient acknowledged the explanation, agreed to proceed with the plan and consent was signed. Patient is being admitted for inpatient treatment for surgery, pain control, PT, OT,  prophylactic antibiotics, VTE prophylaxis, progressive ambulation and ADL's and discharge planning.The patient is planning to be discharged home with HEP.   Therapy Plans: HEP.  Disposition: Home with husband Brett Canales.  Planned DVT Prophylaxis: aspirin 81mg  BID DME needed: Has rolling walker. Cane PCP: Cleared.  TXA: IV Allergies:  - Penicillin - rash Anesthesia Concerns: None.  BMI: 22.5 Last HgbA1c: Not diabetic.  Other: - Osteoporosis, alendronate. Hold 6-12 months postoperative.  - Aspirin 81mg  every other day.  - Hydrocodone, zofran.  - 11/09/22: Hgb 13.8, K+ 4.6, Cr. 0.74.  - 02/02/23: Hgb 14.6. No preop BMP ordered.      Patient's anticipated LOS is less than 2 midnights, meeting these requirements: - Younger than 99 - Lives within 1 hour of care - Has a competent adult at home to recover with post-op recover - NO history of  - Chronic pain requiring opiods  - Diabetes  - Coronary Artery Disease  - Heart failure  - Heart attack  - Stroke  - DVT/VTE  - Cardiac arrhythmia  - Respiratory Failure/COPD  - Renal failure  - Anemia  - Advanced Liver disease

## 2023-02-09 NOTE — H&P (Signed)
TOTAL HIP ADMISSION H&P  Patient is admitted for left total hip arthroplasty.  Subjective:  Chief Complaint: left hip pain  HPI: Karen Campos, 67 y.o. female, has a history of pain and functional disability in the left hip(s) due to arthritis and patient has failed non-surgical conservative treatments for greater than 12 weeks to include NSAID's and/or analgesics, flexibility and strengthening excercises, and activity modification.  Onset of symptoms was gradual starting 10 years ago with rapidlly worsening course since that time.The patient noted no past surgery on the left hip(s).  Patient currently rates pain in the left hip at 10 out of 10 with activity. Patient has night pain, worsening of pain with activity and weight bearing, trendelenberg gait, pain that interfers with activities of daily living, and pain with passive range of motion. Patient has evidence of subchondral cysts, subchondral sclerosis, periarticular osteophytes, and joint space narrowing by imaging studies. This condition presents safety issues increasing the risk of falls.  There is no current active infection.  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   Primary osteoarthritis of right hip 08/01/2015   Past Medical History:  Diagnosis Date   Arthritis    Hx of adenomatous polyp of colon 10/03/2020   diminutive repeat 7-10 yrs   Hyperalbuminemia    mildy elevated - check yearly with CMP   Osteoporosis    Perimenopausal    Skin cancer    shoulder-2015   Wears glasses     Past Surgical History:  Procedure Laterality Date   COLONOSCOPY     10 years ago in [place]-normal exam per pt   COLONOSCOPY     TOTAL HIP ARTHROPLASTY Right 08/01/2015   Procedure: RIGHT TOTAL HIP ARTHROPLASTY ANTERIOR APPROACH;  Surgeon: Samson Frederic, MD;  Location: [facility];  Service: Orthopedics;   Laterality: Right;   VAGINAL DELIVERY     x2    Current Outpatient Medications  Medication Sig Dispense Refill Last Dose   alendronate (FOSAMAX) 70 MG tablet TAKE 1 TABLET EVERY 7 (SEVEN) DAYS. TAKE WITH A FULL GLASS OF WATER ON AN EMPTY STOMACH. 12 tablet 3    aspirin 81 MG tablet Take 81 mg by mouth every other day.      Black Cohosh-SoyIsoflav-Magnol (ESTROVEN MENOPAUSE RELIEF) CAPS Take 1 capsule by mouth daily.       Boswellia-Glucosamine-Vit D (OSTEO BI-FLEX ONE PER DAY) TABS Take 1 tablet by mouth 2 (two) times daily.      Calcium Citrate-Vitamin D (CALCIUM + D PO) Take 2 tablets by mouth at bedtime.      LUTEIN PO Take 1 capsule by mouth daily.      melatonin 5 MG TABS Take 5 mg by mouth at bedtime as needed (sleep).      Multiple Vitamins-Minerals (CENTRUM SILVER ADULT 50+) TABS Take 1 tablet by mouth daily.      mupirocin ointment (BACTROBAN) 2 % Apply 1 Application topically at bedtime.      Omega-3 Fatty Acids (OMEGA 3 500 PO) Take 1,000 mg by mouth daily.      No current facility-administered medications for this visit.   Allergies  Allergen Reactions   Penicillins Hives         Social History   Tobacco Use   Smoking status: Never   Smokeless tobacco: Never  Substance Use Topics   Alcohol use: No  Alcohol/week: 0.0 standard drinks of alcohol    Family History  Problem Relation Age of Onset   Thyroid disease Mother    Glaucoma Mother    Heart disease Father    Heart disease Paternal Aunt    Breast cancer Paternal Grandmother    Colon cancer Neg Hx    Colon polyps Neg Hx    Esophageal cancer Neg Hx    Prostate cancer Neg Hx    Rectal cancer Neg Hx      Review of Systems  Musculoskeletal:  Positive for arthralgias and gait problem.  All other systems reviewed and are negative.   Objective:  Physical Exam Constitutional:      Appearance: Normal appearance.  HENT:     Head: Normocephalic and atraumatic.     Nose: Nose normal.     Mouth/Throat:      Mouth: Mucous membranes are moist.     Pharynx: Oropharynx is clear.  Eyes:     Conjunctiva/sclera: Conjunctivae normal.  Cardiovascular:     Rate and Rhythm: Normal rate and regular rhythm.     Pulses: Normal pulses.     Heart sounds: Normal heart sounds.  Pulmonary:     Effort: Pulmonary effort is normal.     Breath sounds: Normal breath sounds.  Abdominal:     General: Abdomen is flat.     Palpations: Abdomen is soft.  Genitourinary:    Comments: Deferred. Musculoskeletal:     Cervical back: Normal range of motion and neck supple.     Comments: Examination of the left hip reveals no skin wounds or lesions. She has restricted range of motion of the hip. Pain with flexion and rotation of the hip. Pain in the position of impingement. Positive Stinchfield. Mild trochanteric tenderness to palpation. Hip abductor strength 4/5.  Skin:    General: Skin is warm and dry.     Capillary Refill: Capillary refill takes less than 2 seconds.  Neurological:     General: No focal deficit present.     Mental Status: She is alert and oriented to person, place, and time.  Psychiatric:        Mood and Affect: Mood normal.        Behavior: Behavior normal.        Thought Content: Thought content normal.        Judgment: Judgment normal.     Vital signs in last 24 hours: @VSRANGES @  Labs:   Estimated body mass index is 22.32 kg/m as calculated from the following:   Height as of 02/02/23: 5\' 3"  (1.6 m).   Weight as of 02/02/23: 57.2 kg.   Imaging Review Plain radiographs demonstrate severe degenerative joint disease of the left hip(s). The bone quality appears to be adequate for age and reported activity level.      Assessment/Plan:  End stage arthritis, left hip(s)  The patient history, physical examination, clinical judgement of the provider and imaging studies are consistent with end stage degenerative joint disease of the left hip(s) and total hip arthroplasty is deemed  medically necessary. The treatment options including medical management, injection therapy, arthroscopy and arthroplasty were discussed at length. The risks and benefits of total hip arthroplasty were presented and reviewed. The risks due to aseptic loosening, infection, stiffness, dislocation/subluxation,  thromboembolic complications and other imponderables were discussed.  The patient acknowledged the explanation, agreed to proceed with the plan and consent was signed. Patient is being admitted for inpatient treatment for surgery, pain control, PT, OT,  prophylactic antibiotics, VTE prophylaxis, progressive ambulation and ADL's and discharge planning.The patient is planning to be discharged home with HEP.   Therapy Plans: HEP.  Disposition: Home with husband Brett Canales.  Planned DVT Prophylaxis: aspirin 81mg  BID DME needed: Has rolling walker. Cane PCP: Cleared.  TXA: IV Allergies:  - Penicillin - rash Anesthesia Concerns: None.  BMI: 22.5 Last HgbA1c: Not diabetic.  Other: - Osteoporosis, alendronate. Hold 6-12 months postoperative.  - Aspirin 81mg  every other day.  - Hydrocodone, zofran.  - 11/09/22: Hgb 13.8, K+ 4.6, Cr. 0.74.  - 02/02/23: Hgb 14.6. No preop BMP ordered.      Patient's anticipated LOS is less than 2 midnights, meeting these requirements: - Younger than 99 - Lives within 1 hour of care - Has a competent adult at home to recover with post-op recover - NO history of  - Chronic pain requiring opiods  - Diabetes  - Coronary Artery Disease  - Heart failure  - Heart attack  - Stroke  - DVT/VTE  - Cardiac arrhythmia  - Respiratory Failure/COPD  - Renal failure  - Anemia  - Advanced Liver disease

## 2023-02-10 NOTE — Anesthesia Preprocedure Evaluation (Signed)
Anesthesia Evaluation  Patient identified by MRN, date of birth, ID band Patient awake    Reviewed: Allergy & Precautions, Patient's Chart, lab work & pertinent test results  Airway Mallampati: II  TM Distance: >3 FB Neck ROM: Full    Dental no notable dental hx. (+) Teeth Intact, Dental Advisory Given   Pulmonary neg pulmonary ROS   Pulmonary exam normal breath sounds clear to auscultation       Cardiovascular Exercise Tolerance: Good (-) hypertension(-) angina (-) Past MI Normal cardiovascular exam Rhythm:Regular Rate:Normal     Neuro/Psych negative neurological ROS  negative psych ROS   GI/Hepatic negative GI ROS, Neg liver ROS,,,Colon polyps   Endo/Other  negative endocrine ROS    Renal/GU   negative genitourinary   Musculoskeletal  (+) Arthritis , Osteoarthritis,    Abdominal   Peds  Hematology Lab Results      Component                Value               Date                      WBC                      4.2                 02/02/2023                HGB                      14.6                02/02/2023                HCT                      42.4                02/02/2023                MCV                      93.4                02/02/2023                PLT                      206                 02/02/2023              Anesthesia Other Findings All: PCN  Reproductive/Obstetrics                             Anesthesia Physical Anesthesia Plan  ASA: 2  Anesthesia Plan: Spinal   Post-op Pain Management: Ofirmev IV (intra-op)* and Lidocaine infusion*   Induction:   PONV Risk Score and Plan: Treatment may vary due to age or medical condition, Ondansetron and Propofol infusion  Airway Management Planned: Natural Airway and Nasal Cannula  Additional Equipment: None  Intra-op Plan:   Post-operative Plan:   Informed Consent: I have reviewed the patients History and  Physical, chart, labs and discussed the procedure including the risks, benefits and  alternatives for the proposed anesthesia with the patient or authorized representative who has indicated his/her understanding and acceptance.     Dental advisory given  Plan Discussed with: CRNA  Anesthesia Plan Comments:         Anesthesia Quick Evaluation

## 2023-02-11 ENCOUNTER — Ambulatory Visit (HOSPITAL_COMMUNITY)
Admission: RE | Admit: 2023-02-11 | Discharge: 2023-02-12 | Disposition: A | Discharge status: Home / Self Care | Payer: BC Managed Care – PPO | Source: Ambulatory Visit | Attending: Orthopedic Surgery | Admitting: Orthopedic Surgery

## 2023-02-11 ENCOUNTER — Other Ambulatory Visit: Payer: Self-pay

## 2023-02-11 ENCOUNTER — Ambulatory Visit (HOSPITAL_COMMUNITY): Payer: BC Managed Care – PPO

## 2023-02-11 ENCOUNTER — Ambulatory Visit (HOSPITAL_COMMUNITY): Payer: Self-pay | Admitting: Anesthesiology

## 2023-02-11 ENCOUNTER — Encounter (HOSPITAL_COMMUNITY): Payer: Self-pay | Admitting: Orthopedic Surgery

## 2023-02-11 ENCOUNTER — Encounter (HOSPITAL_COMMUNITY)
Admission: RE | Disposition: A | Discharge status: Home / Self Care | Payer: Self-pay | Source: Ambulatory Visit | Attending: Orthopedic Surgery

## 2023-02-11 ENCOUNTER — Ambulatory Visit (HOSPITAL_COMMUNITY): Payer: BC Managed Care – PPO | Admitting: Anesthesiology

## 2023-02-11 DIAGNOSIS — Z96649 Presence of unspecified artificial hip joint: Secondary | ICD-10-CM

## 2023-02-11 DIAGNOSIS — M1612 Unilateral primary osteoarthritis, left hip: Secondary | ICD-10-CM | POA: Diagnosis present

## 2023-02-11 DIAGNOSIS — Z96642 Presence of left artificial hip joint: Secondary | ICD-10-CM

## 2023-02-11 HISTORY — PX: TOTAL HIP ARTHROPLASTY: SHX124

## 2023-02-11 LAB — TYPE AND SCREEN
ABO/RH(D): A POS
Antibody Screen: NEGATIVE

## 2023-02-11 SURGERY — ARTHROPLASTY, HIP, TOTAL, ANTERIOR APPROACH
Anesthesia: Spinal | Site: Hip | Laterality: Left

## 2023-02-11 MED ORDER — KETOROLAC TROMETHAMINE 30 MG/ML IJ SOLN
INTRAMUSCULAR | Status: AC
  Filled 2023-02-11: qty 1

## 2023-02-11 MED ORDER — PROPOFOL 500 MG/50ML IV EMUL
INTRAVENOUS | Status: DC | PRN
  Administered 2023-02-11: 100 ug/kg/min via INTRAVENOUS

## 2023-02-11 MED ORDER — DEXAMETHASONE SODIUM PHOSPHATE 10 MG/ML IJ SOLN
INTRAMUSCULAR | Status: AC
  Filled 2023-02-11: qty 1

## 2023-02-11 MED ORDER — HYDROCODONE-ACETAMINOPHEN 5-325 MG PO TABS
1.0000 | ORAL_TABLET | ORAL | Status: DC | PRN
  Administered 2023-02-11: 1 via ORAL
  Administered 2023-02-12 (×2): 2 via ORAL
  Filled 2023-02-11 (×2): qty 2
  Filled 2023-02-11: qty 1

## 2023-02-11 MED ORDER — MIDAZOLAM HCL 2 MG/2ML IJ SOLN
INTRAMUSCULAR | Status: DC | PRN
  Administered 2023-02-11: 2 mg via INTRAVENOUS

## 2023-02-11 MED ORDER — POLYETHYLENE GLYCOL 3350 17 G PO PACK: 17.0000 g | PACK | Freq: Every day | ORAL | 0 refills | Status: AC | PRN

## 2023-02-11 MED ORDER — HYDROMORPHONE HCL 1 MG/ML IJ SOLN
0.2500 mg | INTRAMUSCULAR | Status: DC | PRN
Start: 2023-02-11 — End: 2023-02-11

## 2023-02-11 MED ORDER — LACTATED RINGERS IV SOLN: INTRAVENOUS | Status: DC

## 2023-02-11 MED ORDER — ONDANSETRON HCL 4 MG/2ML IJ SOLN
4.0000 mg | Freq: Four times a day (QID) | INTRAMUSCULAR | Status: DC | PRN
  Administered 2023-02-11: 4 mg via INTRAVENOUS
  Filled 2023-02-11: qty 2

## 2023-02-11 MED ORDER — SODIUM CHLORIDE (PF) 0.9 % IJ SOLN
INTRAMUSCULAR | Status: DC | PRN
  Administered 2023-02-11: 20 mL

## 2023-02-11 MED ORDER — SODIUM CHLORIDE 0.9 % IR SOLN
Status: DC | PRN
  Administered 2023-02-11: 250 mL
  Administered 2023-02-11: 1000 mL

## 2023-02-11 MED ORDER — SODIUM CHLORIDE (PF) 0.9 % IJ SOLN
INTRAMUSCULAR | Status: AC
  Filled 2023-02-11: qty 50

## 2023-02-11 MED ORDER — OXYCODONE HCL 5 MG/5ML PO SOLN: 5.0000 mg | Freq: Once | ORAL | Status: DC | PRN

## 2023-02-11 MED ORDER — HYDROMORPHONE HCL 1 MG/ML IJ SOLN
INTRAMUSCULAR | Status: AC
  Filled 2023-02-11: qty 1

## 2023-02-11 MED ORDER — ONDANSETRON HCL 4 MG/2ML IJ SOLN
INTRAMUSCULAR | Status: AC
  Filled 2023-02-11: qty 2

## 2023-02-11 MED ORDER — BISACODYL 10 MG RE SUPP: 10.0000 mg | Freq: Every day | RECTAL | Status: DC | PRN

## 2023-02-11 MED ORDER — POVIDONE-IODINE 10 % EX SWAB: 2.0000 | Freq: Once | CUTANEOUS | Status: DC

## 2023-02-11 MED ORDER — CEFAZOLIN SODIUM-DEXTROSE 2-4 GM/100ML-% IV SOLN
2.0000 g | INTRAVENOUS | Status: AC
  Administered 2023-02-11: 2 g via INTRAVENOUS
  Filled 2023-02-11: qty 100

## 2023-02-11 MED ORDER — SODIUM CHLORIDE 0.9 % IV SOLN: INTRAVENOUS | Status: DC

## 2023-02-11 MED ORDER — MIDAZOLAM HCL 2 MG/2ML IJ SOLN
INTRAMUSCULAR | Status: AC
  Filled 2023-02-11: qty 2

## 2023-02-11 MED ORDER — ONDANSETRON HCL 4 MG PO TABS: 4.0000 mg | ORAL_TABLET | Freq: Three times a day (TID) | ORAL | 0 refills | Status: AC | PRN

## 2023-02-11 MED ORDER — PHENOL 1.4 % MT LIQD: 1.0000 | OROMUCOSAL | Status: DC | PRN

## 2023-02-11 MED ORDER — ONDANSETRON HCL 4 MG/2ML IJ SOLN: 4.0000 mg | Freq: Once | INTRAMUSCULAR | Status: DC | PRN

## 2023-02-11 MED ORDER — SENNA 8.6 MG PO TABS
1.0000 | ORAL_TABLET | Freq: Two times a day (BID) | ORAL | Status: DC
  Administered 2023-02-11: 8.6 mg via ORAL
  Filled 2023-02-11: qty 1

## 2023-02-11 MED ORDER — AMISULPRIDE (ANTIEMETIC) 5 MG/2ML IV SOLN
10.0000 mg | Freq: Once | INTRAVENOUS | Status: AC | PRN
  Administered 2023-02-11: 10 mg via INTRAVENOUS

## 2023-02-11 MED ORDER — HYDROCODONE-ACETAMINOPHEN 7.5-325 MG PO TABS
1.0000 | ORAL_TABLET | ORAL | Status: DC | PRN
Start: 2023-02-11 — End: 2023-02-12

## 2023-02-11 MED ORDER — ISOPROPYL ALCOHOL 70 % SOLN
Status: DC | PRN
  Administered 2023-02-11: 1 via TOPICAL

## 2023-02-11 MED ORDER — BUPIVACAINE-EPINEPHRINE 0.25% -1:200000 IJ SOLN
INTRAMUSCULAR | Status: AC
  Filled 2023-02-11: qty 1

## 2023-02-11 MED ORDER — LACTATED RINGERS IV BOLUS: 250.0000 mL | Freq: Once | INTRAVENOUS | Status: DC

## 2023-02-11 MED ORDER — PANTOPRAZOLE SODIUM 40 MG PO TBEC
40.0000 mg | DELAYED_RELEASE_TABLET | Freq: Every day | ORAL | Status: DC
  Administered 2023-02-12: 40 mg via ORAL
  Filled 2023-02-11: qty 1

## 2023-02-11 MED ORDER — DEXAMETHASONE SODIUM PHOSPHATE 10 MG/ML IJ SOLN
INTRAMUSCULAR | Status: DC | PRN
  Administered 2023-02-11: 10 mg via INTRAVENOUS

## 2023-02-11 MED ORDER — HYDROCODONE-ACETAMINOPHEN 5-325 MG PO TABS: 1.0000 | ORAL_TABLET | ORAL | 0 refills | Status: AC | PRN

## 2023-02-11 MED ORDER — PROPOFOL 10 MG/ML IV BOLUS
INTRAVENOUS | Status: DC | PRN
  Administered 2023-02-11: 30 mg via INTRAVENOUS

## 2023-02-11 MED ORDER — SENNA 8.6 MG PO TABS: 2.0000 | ORAL_TABLET | Freq: Every day | ORAL | 0 refills | Status: AC

## 2023-02-11 MED ORDER — METHOCARBAMOL 1000 MG/10ML IJ SOLN
500.0000 mg | Freq: Four times a day (QID) | INTRAMUSCULAR | Status: DC | PRN
  Administered 2023-02-11: 500 mg via INTRAVENOUS

## 2023-02-11 MED ORDER — KETOROLAC TROMETHAMINE 30 MG/ML IJ SOLN
INTRAMUSCULAR | Status: DC | PRN
  Administered 2023-02-11: 30 mg via INTRAVENOUS

## 2023-02-11 MED ORDER — DIPHENHYDRAMINE HCL 12.5 MG/5ML PO ELIX: 12.5000 mg | ORAL_SOLUTION | ORAL | Status: DC | PRN

## 2023-02-11 MED ORDER — ACETAMINOPHEN 325 MG PO TABS
325.0000 mg | ORAL_TABLET | Freq: Four times a day (QID) | ORAL | Status: DC | PRN
Start: 2023-02-12 — End: 2023-02-12
  Filled 2023-02-11: qty 2

## 2023-02-11 MED ORDER — MENTHOL 3 MG MT LOZG
1.0000 | LOZENGE | OROMUCOSAL | Status: DC | PRN
Start: 2023-02-11 — End: 2023-02-12

## 2023-02-11 MED ORDER — ACETAMINOPHEN 500 MG PO TABS
1000.0000 mg | ORAL_TABLET | Freq: Once | ORAL | Status: AC
  Administered 2023-02-11: 1000 mg via ORAL
  Filled 2023-02-11: qty 2

## 2023-02-11 MED ORDER — METHOCARBAMOL 1000 MG/10ML IJ SOLN
INTRAMUSCULAR | Status: AC
  Filled 2023-02-11: qty 10

## 2023-02-11 MED ORDER — ALUM & MAG HYDROXIDE-SIMETH 200-200-20 MG/5ML PO SUSP: 30.0000 mL | ORAL | Status: DC | PRN

## 2023-02-11 MED ORDER — CHLORHEXIDINE GLUCONATE 0.12 % MT SOLN
15.0000 mL | Freq: Once | OROMUCOSAL | Status: AC
  Administered 2023-02-11: 15 mL via OROMUCOSAL

## 2023-02-11 MED ORDER — CEFAZOLIN SODIUM-DEXTROSE 2-4 GM/100ML-% IV SOLN
2.0000 g | Freq: Four times a day (QID) | INTRAVENOUS | Status: AC
Start: 2023-02-11 — End: 2023-02-11
  Administered 2023-02-11 (×2): 2 g via INTRAVENOUS
  Filled 2023-02-11: qty 100

## 2023-02-11 MED ORDER — HYDROMORPHONE HCL 1 MG/ML IJ SOLN
0.2500 mg | INTRAMUSCULAR | Status: DC | PRN
  Administered 2023-02-11 (×3): 0.5 mg via INTRAVENOUS

## 2023-02-11 MED ORDER — METOCLOPRAMIDE HCL 5 MG/ML IJ SOLN: 5.0000 mg | Freq: Three times a day (TID) | INTRAMUSCULAR | Status: DC | PRN

## 2023-02-11 MED ORDER — CEFAZOLIN SODIUM-DEXTROSE 2-4 GM/100ML-% IV SOLN
INTRAVENOUS | Status: AC
  Filled 2023-02-11: qty 100

## 2023-02-11 MED ORDER — DOCUSATE SODIUM 100 MG PO CAPS: 100.0000 mg | ORAL_CAPSULE | Freq: Two times a day (BID) | ORAL | 0 refills | Status: AC

## 2023-02-11 MED ORDER — AMISULPRIDE (ANTIEMETIC) 5 MG/2ML IV SOLN
INTRAVENOUS | Status: AC
  Filled 2023-02-11: qty 4

## 2023-02-11 MED ORDER — ASPIRIN 81 MG PO CHEW: 81.0000 mg | CHEWABLE_TABLET | Freq: Two times a day (BID) | ORAL | 0 refills | Status: AC

## 2023-02-11 MED ORDER — ASPIRIN 81 MG PO CHEW
81.0000 mg | CHEWABLE_TABLET | Freq: Two times a day (BID) | ORAL | Status: DC
  Administered 2023-02-11 – 2023-02-12 (×2): 81 mg via ORAL
  Filled 2023-02-11 (×2): qty 1

## 2023-02-11 MED ORDER — MORPHINE SULFATE (PF) 2 MG/ML IV SOLN: 0.5000 mg | INTRAVENOUS | Status: DC | PRN

## 2023-02-11 MED ORDER — ISOPROPYL ALCOHOL 70 % SOLN
Status: AC
  Filled 2023-02-11: qty 480

## 2023-02-11 MED ORDER — TRANEXAMIC ACID-NACL 1000-0.7 MG/100ML-% IV SOLN
1000.0000 mg | INTRAVENOUS | Status: AC
  Administered 2023-02-11: 1000 mg via INTRAVENOUS
  Filled 2023-02-11: qty 100

## 2023-02-11 MED ORDER — METOCLOPRAMIDE HCL 5 MG PO TABS: 5.0000 mg | ORAL_TABLET | Freq: Three times a day (TID) | ORAL | Status: DC | PRN

## 2023-02-11 MED ORDER — ORAL CARE MOUTH RINSE
15.0000 mL | Freq: Once | OROMUCOSAL | Status: AC
Start: 2023-02-11 — End: 2023-02-11

## 2023-02-11 MED ORDER — ONDANSETRON HCL 4 MG PO TABS: 4.0000 mg | ORAL_TABLET | Freq: Four times a day (QID) | ORAL | Status: DC | PRN

## 2023-02-11 MED ORDER — ACETAMINOPHEN 10 MG/ML IV SOLN
1000.0000 mg | Freq: Once | INTRAVENOUS | Status: DC | PRN
Start: 2023-02-11 — End: 2023-02-11

## 2023-02-11 MED ORDER — POVIDONE-IODINE 10 % EX SWAB
2.0000 | Freq: Once | CUTANEOUS | Status: AC
  Administered 2023-02-11: 2 via TOPICAL

## 2023-02-11 MED ORDER — POLYETHYLENE GLYCOL 3350 17 G PO PACK: 17.0000 g | PACK | Freq: Every day | ORAL | Status: DC | PRN

## 2023-02-11 MED ORDER — WATER FOR IRRIGATION, STERILE IR SOLN
Status: DC | PRN
  Administered 2023-02-11: 1000 mL

## 2023-02-11 MED ORDER — BUPIVACAINE IN DEXTROSE 0.75-8.25 % IT SOLN
INTRATHECAL | Status: DC | PRN
  Administered 2023-02-11: 1.7 mL via INTRATHECAL

## 2023-02-11 MED ORDER — ONDANSETRON HCL 4 MG/2ML IJ SOLN
INTRAMUSCULAR | Status: DC | PRN
  Administered 2023-02-11: 4 mg via INTRAVENOUS

## 2023-02-11 MED ORDER — DOCUSATE SODIUM 100 MG PO CAPS
100.0000 mg | ORAL_CAPSULE | Freq: Two times a day (BID) | ORAL | Status: DC
  Administered 2023-02-11 – 2023-02-12 (×2): 100 mg via ORAL
  Filled 2023-02-11 (×2): qty 1

## 2023-02-11 MED ORDER — OXYCODONE HCL 5 MG PO TABS: 5.0000 mg | ORAL_TABLET | Freq: Once | ORAL | Status: DC | PRN

## 2023-02-11 MED ORDER — LACTATED RINGERS IV BOLUS
500.0000 mL | Freq: Once | INTRAVENOUS | Status: AC
  Administered 2023-02-11: 500 mL via INTRAVENOUS

## 2023-02-11 MED ORDER — BUPIVACAINE-EPINEPHRINE 0.25% -1:200000 IJ SOLN
INTRAMUSCULAR | Status: DC | PRN
  Administered 2023-02-11: 30 mL

## 2023-02-11 MED ORDER — METHOCARBAMOL 500 MG PO TABS
500.0000 mg | ORAL_TABLET | Freq: Four times a day (QID) | ORAL | Status: DC | PRN
  Administered 2023-02-11 – 2023-02-12 (×2): 500 mg via ORAL
  Filled 2023-02-11 (×2): qty 1

## 2023-02-11 SURGICAL SUPPLY — 51 items
BAG COUNTER SPONGE SURGICOUNT (BAG) IMPLANT
BAG ZIPLOCK 12X15 (MISCELLANEOUS) IMPLANT
CHLORAPREP W/TINT 26 (MISCELLANEOUS) ×2 IMPLANT
COVER PERINEAL POST (MISCELLANEOUS) ×2 IMPLANT
COVER SURGICAL LIGHT HANDLE (MISCELLANEOUS) ×2 IMPLANT
DERMABOND ADVANCED .7 DNX12 (GAUZE/BANDAGES/DRESSINGS) ×4 IMPLANT
DRAPE IMP U-DRAPE 54X76 (DRAPES) ×2 IMPLANT
DRAPE SHEET LG 3/4 BI-LAMINATE (DRAPES) ×6 IMPLANT
DRAPE STERI IOBAN 125X83 (DRAPES) ×2 IMPLANT
DRAPE U-SHAPE 47X51 STRL (DRAPES) ×2 IMPLANT
DRSG AQUACEL AG ADV 3.5X10 (GAUZE/BANDAGES/DRESSINGS) ×2 IMPLANT
ELECT REM PT RETURN 15FT ADLT (MISCELLANEOUS) ×2 IMPLANT
GAUZE SPONGE 4X4 12PLY STRL (GAUZE/BANDAGES/DRESSINGS) ×2 IMPLANT
GLOVE BIO SURGEON STRL SZ7 (GLOVE) ×2 IMPLANT
GLOVE BIO SURGEON STRL SZ8.5 (GLOVE) ×4 IMPLANT
GLOVE BIOGEL PI IND STRL 7.5 (GLOVE) ×2 IMPLANT
GLOVE BIOGEL PI IND STRL 8.5 (GLOVE) ×2 IMPLANT
GOWN SPEC L3 XXLG W/TWL (GOWN DISPOSABLE) ×2 IMPLANT
GOWN STRL REUS W/ TWL XL LVL3 (GOWN DISPOSABLE) ×2 IMPLANT
GOWN STRL REUS W/TWL XL LVL3 (GOWN DISPOSABLE) ×1
HANDPIECE INTERPULSE COAX TIP (DISPOSABLE) ×1
HEAD CERAMIC BIOLOX 36 (Head) IMPLANT
HOLDER FOLEY CATH W/STRAP (MISCELLANEOUS) ×2 IMPLANT
HOOD PEEL AWAY T7 (MISCELLANEOUS) ×6 IMPLANT
KIT TURNOVER KIT A (KITS) IMPLANT
LINER G7 VIT E +5 36 D (Liner) IMPLANT
MANIFOLD NEPTUNE II (INSTRUMENTS) ×2 IMPLANT
MARKER SKIN DUAL TIP RULER LAB (MISCELLANEOUS) ×2 IMPLANT
NDL SAFETY ECLIPSE 18X1.5 (NEEDLE) ×2 IMPLANT
NDL SPNL 18GX3.5 QUINCKE PK (NEEDLE) ×2 IMPLANT
NEEDLE SPNL 18GX3.5 QUINCKE PK (NEEDLE) ×1
PACK ANTERIOR HIP CUSTOM (KITS) ×2 IMPLANT
SAW OSC TIP CART 19.5X105X1.3 (SAW) ×2 IMPLANT
SEALER BIPOLAR AQUA 6.0 (INSTRUMENTS) ×2 IMPLANT
SET HNDPC FAN SPRY TIP SCT (DISPOSABLE) ×2 IMPLANT
SHELL ACET G7 3H 50 SZD (Shell) IMPLANT
SOLUTION PRONTOSAN WOUND 350ML (IRRIGATION / IRRIGATOR) ×2 IMPLANT
SPIKE FLUID TRANSFER (MISCELLANEOUS) ×2 IMPLANT
STEM FEM CMTLS 39.6X101 133D (Stem) IMPLANT
SUT MNCRL AB 3-0 PS2 18 (SUTURE) ×2 IMPLANT
SUT MON AB 2-0 CT1 36 (SUTURE) ×2 IMPLANT
SUT STRATAFIX PDO 1 14 VIOLET (SUTURE) ×1
SUT STRATFX PDO 1 14 VIOLET (SUTURE) ×1
SUT VIC AB 2-0 CT1 27 (SUTURE)
SUT VIC AB 2-0 CT1 TAPERPNT 27 (SUTURE) IMPLANT
SUTURE STRATFX PDO 1 14 VIOLET (SUTURE) ×2 IMPLANT
SYR 3ML LL SCALE MARK (SYRINGE) ×2 IMPLANT
TOWEL GREEN STERILE FF (TOWEL DISPOSABLE) ×2 IMPLANT
TRAY FOLEY MTR SLVR 16FR STAT (SET/KITS/TRAYS/PACK) IMPLANT
TUBE SUCTION HIGH CAP CLEAR NV (SUCTIONS) ×2 IMPLANT
WATER STERILE IRR 1000ML POUR (IV SOLUTION) ×2 IMPLANT

## 2023-02-11 NOTE — Anesthesia Postprocedure Evaluation (Signed)
Anesthesia Post Note  Patient: Janieya Chinchilla [facility]  Procedure(s) Performed: TOTAL HIP ARTHROPLASTY ANTERIOR APPROACH (Left: Hip)     Patient location during evaluation: Nursing Unit Anesthesia Type: Spinal Level of consciousness: oriented and awake and alert Pain management: pain level controlled Vital Signs Assessment: post-procedure vital signs reviewed and stable Respiratory status: spontaneous breathing and respiratory function stable Cardiovascular status: blood pressure returned to baseline and stable Postop Assessment: no headache, no backache, no apparent nausea or vomiting and patient able to bend at knees Anesthetic complications: no   No notable events documented.  Last Vitals:  Vitals:   02/11/23 1730 02/11/23 1845  BP: (!) 106/56 128/68  Pulse: (!) 58 66  Resp:    Temp:    SpO2: 95% 100%    Last Pain:  Vitals:   02/11/23 1845  TempSrc:   PainSc: 0-No pain                 Trevor Iha

## 2023-02-11 NOTE — Transfer of Care (Signed)
Immediate Anesthesia Transfer of Care Note  Patient: Karen Campos [facility]  Procedure(s) Performed: TOTAL HIP ARTHROPLASTY ANTERIOR APPROACH (Left: Hip)  Patient Location: PACU  Anesthesia Type:Spinal  Level of Consciousness: awake and drowsy  Airway & Oxygen Therapy: Patient Spontanous Breathing and Patient connected to face mask oxygen  Post-op Assessment: Report given to RN and Post -op Vital signs reviewed and stable  Post vital signs: Reviewed and stable  Last Vitals:  Vitals Value Taken Time  BP 100/61 02/11/23 1357  Temp    Pulse 58 02/11/23 1358  Resp 16 02/11/23 1358  SpO2 100 % 02/11/23 1358  Vitals shown include unfiled device data.  Last Pain:  Vitals:   02/11/23 0940  TempSrc:   PainSc: 0-No pain         Complications: No notable events documented.

## 2023-02-11 NOTE — Discharge Instructions (Signed)

## 2023-02-11 NOTE — Interval H&P Note (Signed)
History and Physical Interval Note:  02/11/2023 9:18 AM  Karen Campos  has presented today for surgery, with the diagnosis of Left hip osteoarthritis.  The various methods of treatment have been discussed with the patient and family. After consideration of risks, benefits and other options for treatment, the patient has consented to  Procedure(s) with comments: TOTAL HIP ARTHROPLASTY ANTERIOR APPROACH (Left) - 130 flip room as a surgical intervention.  The patient's history has been reviewed, patient examined, no change in status, stable for surgery.  I have reviewed the patient's chart and labs.  Questions were answered to the patient's satisfaction.     Iline Oven Kegan Mckeithan

## 2023-02-11 NOTE — Evaluation (Signed)
Physical Therapy Evaluation Patient Details Name: Karen Campos MRN: [id number] DOB: 1956/02/28 Today's Date: 02/11/2023  History of Present Illness  67 yo female s/p L DATHA 02/11/2023, and R DATHA was 2017. Was attempting to DC from PACU, however was vry nauseous with movement and L thigh and glut was still very numb. Admitted to the unit.  Clinical Impression  Pt is s/p L DATHA resulting in the deficits listed below (see PT Problem List). Pt will progress nicely once spinal wears off and less nauseous with movement. Tolerated session as well as she could and pt is motivated with good assistnce from her husband as well. Will stay the night and feel she should progress with 1 session in the morning prior to DC home.           If plan is discharge home, recommend the following: A little help with walking and/or transfers;Assistance with cooking/housework;Assist for transportation;Help with stairs or ramp for entrance   Can travel by private vehicle        Equipment Recommendations None recommended by PT  Recommendations for Other Services       Functional Status Assessment Patient has had a recent decline in their functional status and demonstrates the ability to make significant improvements in function in a reasonable and predictable amount of time.     Precautions / Restrictions Precautions Precautions: None      Mobility  Bed Mobility Overal bed mobility: Needs Assistance Bed Mobility: Supine to Sit, Sit to Supine     Supine to sit: Min assist Sit to supine: Min assist   General bed mobility comments: Min A with LLE due to no hip flexion activity present due to spinal still has not worn off    Transfers Overall transfer level: Needs assistance Equipment used: Rolling walker (2 wheels) Transfers: Sit to/from Stand Sit to Stand: Supervision           General transfer comment: stand pivot with a few step to bed side commode. Pt able to perform and just use  RLE for all movment and let LLE just hang since very little muscle activity.    Ambulation/Gait                  Stairs            Wheelchair Mobility     Tilt Bed    Modified Rankin (Stroke Patients Only)       Balance Overall balance assessment: Needs assistance Sitting-balance support: Bilateral upper extremity supported Sitting balance-Leahy Scale: Good     Standing balance support: Bilateral upper extremity supported, During functional activity Standing balance-Leahy Scale: Fair                               Pertinent Vitals/Pain Pain Assessment Pain Assessment: No/denies pain (no pain at this time, L leg still completely numb from the mid thigh up and glut and perinium area)    Home Living Family/patient expects to be discharged to:: Private residence Living Arrangements: Spouse/significant other Available Help at Discharge: Family Type of Home: House Home Access: Stairs to enter Entrance Stairs-Rails: Right (more like a grab bar not a rail) Secretary/administrator of Steps: 3 small steps   Home Layout: Two level;Able to live on main level with bedroom/bathroom Home Equipment: Rolling Walker (2 wheels);Shower seat - built in;BSC/3in1      Prior Function Prior Level of Function : Independent/Modified Independent;Working/employed (Engineer, site)  Extremity/Trunk Assessment                Communication   Communication Communication: No apparent difficulties  Cognition Arousal: Alert   Overall Cognitive Status: Within Functional Limits for tasks assessed                                          General Comments      Exercises     Assessment/Plan    PT Assessment Patient needs continued PT services  PT Problem List Decreased strength;Decreased activity tolerance;Decreased mobility       PT Treatment Interventions DME instruction;Gait training;Stair training;Functional  mobility training;Therapeutic activities;Therapeutic exercise;Patient/family education    PT Goals (Current goals can be found in the Care Plan section)  Acute Rehab PT Goals Patient Stated Goal: I want to be able to move around more with less pain PT Goal Formulation: With patient Time For Goal Achievement: 02/25/23 Potential to Achieve Goals: Good    Frequency 7X/week     Co-evaluation               AM-PAC PT "6 Clicks" Mobility  Outcome Measure Help needed turning from your back to your side while in a flat bed without using bedrails?: None Help needed moving from lying on your back to sitting on the side of a flat bed without using bedrails?: A Little Help needed moving to and from a bed to a chair (including a wheelchair)?: A Little Help needed standing up from a chair using your arms (e.g., wheelchair or bedside chair)?: A Little Help needed to walk in hospital room?: A Lot Help needed climbing 3-5 steps with a railing? : A Lot 6 Click Score: 17    End of Session   Activity Tolerance: Patient tolerated treatment well Patient left: in bed;with call bell/phone within reach;with family/visitor present Nurse Communication: Mobility status PT Visit Diagnosis: Other abnormalities of gait and mobility (R26.89)    Time: 1800-1840 PT Time Calculation (min) (ACUTE ONLY): 40 min   Charges:   PT Evaluation $PT Eval Low Complexity: 1 Low PT Treatments $Therapeutic Activity: 8-22 mins PT General Charges $$ ACUTE PT VISIT: 1 Visit         Clois Dupes, PT, MPT Acute Rehabilitation Services Office: [phone] If a weekend: secure chat groups: [facility] PT, [facility] OT, [facility] SLP 02/11/2023   Kolby Schara 02/11/2023, 7:16 PM

## 2023-02-11 NOTE — Op Note (Signed)
OPERATIVE REPORT  SURGEON: Samson Frederic, MD   ASSISTANT: Clint Bolder, PA-C.  PREOPERATIVE DIAGNOSIS: Left hip arthritis.   POSTOPERATIVE DIAGNOSIS: Left hip arthritis.   PROCEDURE: Left total hip arthroplasty, anterior approach.   IMPLANTS: Biomet Taperloc Complete Microplasty stem, size 8 x 101 mm, high offset. Biomet G7 OsseoTi Cup, size 50 mm. Biomet Vivacit-E liner, size 36 mm, D, +5 mm neutral. Biomet Biolox ceramic head ball, size 36 - 3 mm.  ANESTHESIA:  MAC and Spinal  ESTIMATED BLOOD LOSS: 350 mL.  ANTIBIOTICS: 2g Ancef.  DRAINS: None.  COMPLICATIONS: None.   CONDITION: PACU - hemodynamically stable.   BRIEF CLINICAL NOTE: Karen Campos is a 67 y.o. female with a long-standing history of Left hip arthritis. After failing conservative management, the patient was indicated for total hip arthroplasty. The risks, benefits, and alternatives to the procedure were explained, and the patient elected to proceed.  PROCEDURE IN DETAIL: Surgical site was marked by myself in the pre-op holding area. Once inside the operating room, spinal anesthesia was obtained, and a foley catheter was inserted. The patient was then positioned on the Hana table.  All bony prominences were well padded.  The hip was prepped and draped in the normal sterile surgical fashion.  A time-out was called verifying side and site of surgery. The patient received IV antibiotics within 60 minutes of beginning the procedure.   Bikini incision was made, and superficial dissection was performed lateral to the ASIS. The direct anterior approach to the hip was performed through the Hueter interval.  Lateral femoral circumflex vessels were treated with the Auqumantys. The anterior capsule was exposed and an inverted T capsulotomy was made. The femoral neck cut was made to the level of the templated cut.  A corkscrew was placed into the head and the head was removed.  The femoral head was found to have eburnated  bone. The head was passed to the back table and was measured. Pubofemoral ligament was released off of the calcar, taking care to stay on bone. Superior capsule was released from the greater trochanter, taking care to stay lateral to the posterior border of the femoral neck in order to preserve the short external rotators.   Acetabular exposure was achieved, and the pulvinar and labrum were excised. Sequential reaming of the acetabulum was then performed up to a size 49 mm reamer. A 50 mm cup was then opened and impacted into place at approximately 40 degrees of abduction and 20 degrees of anteversion. The final polyethylene liner was impacted into place and acetabular osteophytes were removed.    I then gained femoral exposure taking care to protect the abductors and greater trochanter.  This was performed using standard external rotation, extension, and adduction.  A cookie cutter was used to enter the femoral canal, and then the femoral canal finder was placed.  Sequential broaching was performed up to a size 8.  Calcar planer was used on the femoral neck remnant.  I placed a high offset neck and a trial head ball.  The hip was reduced.  Leg lengths and offset were checked fluoroscopically.  The hip was dislocated and trial components were removed.  The final implants were placed, and the hip was reduced.  Fluoroscopy was used to confirm component position and leg lengths.  At 90 degrees of external rotation and full extension, the hip was stable to an anterior directed force.   The wound was copiously irrigated with Prontosan solution and normal saline using pulse lavage.  Marcaine solution was injected into the periarticular soft tissue.  The wound was closed in layers using #1 Vicryl and V-Loc for the fascia, 2-0 Vicryl for the subcutaneous fat, 2-0 Monocryl for the deep dermal layer, 3-0 running Monocryl subcuticular stitch, and Dermabond for the skin.  Once the glue was fully dried, an Aquacell Ag  dressing was applied.  The patient was transported to the recovery room in stable condition.  Sponge, needle, and instrument counts were correct at the end of the case x2.  The patient tolerated the procedure well and there were no known complications.  Please note that a surgical assistant was a medical necessity for this procedure to perform it in a safe and expeditious manner. Assistant was necessary to provide appropriate retraction of vital neurovascular structures, to prevent femoral fracture, and to allow for anatomic placement of the prosthesis.

## 2023-02-11 NOTE — Anesthesia Procedure Notes (Signed)
Spinal  Patient location during procedure: OR Start time: 02/11/2023 11:31 AM End time: 02/11/2023 11:33 AM Reason for block: surgical anesthesia Staffing Performed: resident/CRNA  Resident/CRNA: [place], Maxton Noreen C, CRNA Performed by: [place], Malorie Bigford C, CRNA Authorized by: Trevor Iha, MD   Preanesthetic Checklist Completed: patient identified, IV checked, site marked, risks and benefits discussed, surgical consent, monitors and equipment checked, pre-op evaluation and timeout performed Spinal Block Patient position: sitting Prep: DuraPrep and site prepped and draped Patient monitoring: heart rate, cardiac monitor, continuous pulse ox and blood pressure Approach: midline Location: L3-4 Injection technique: single-shot Needle Needle type: Pencan  Needle gauge: 24 G Needle length: 9 cm Assessment Sensory level: T4 Events: CSF return Additional Notes IV functioning, monitors applied to pt. Expiration date of kit checked and confirmed to be in date. Sterile prep and drape, hand hygiene and sterile gloved used. Pt was positioned and spine was prepped in sterile fashion. Skin was anesthetized with lidocaine. Free flow of clear CSF obtained prior to injecting local anesthetic into CSF x 1 attempt. Spinal needle aspirated freely following injection. Needle was carefully withdrawn, and pt tolerated procedure well. Loss of motor and sensory on exam post injection.

## 2023-02-12 DIAGNOSIS — M1612 Unilateral primary osteoarthritis, left hip: Secondary | ICD-10-CM | POA: Diagnosis not present

## 2023-02-12 LAB — CBC
HCT: 32.1 % — ABNORMAL LOW (ref 36.0–46.0)
Hemoglobin: 11.3 g/dL — ABNORMAL LOW (ref 12.0–15.0)
MCH: 32.4 pg (ref 26.0–34.0)
MCHC: 35.2 g/dL (ref 30.0–36.0)
MCV: 92 fL (ref 80.0–100.0)
Platelets: 164 10*3/uL (ref 150–400)
RBC: 3.49 MIL/uL — ABNORMAL LOW (ref 3.87–5.11)
RDW: 11.9 % (ref 11.5–15.5)
WBC: 9.1 10*3/uL (ref 4.0–10.5)
nRBC: 0 % (ref 0.0–0.2)

## 2023-02-12 LAB — BASIC METABOLIC PANEL
Anion gap: 8 (ref 5–15)
BUN: 14 mg/dL (ref 8–23)
CO2: 23 mmol/L (ref 22–32)
Calcium: 8.4 mg/dL — ABNORMAL LOW (ref 8.9–10.3)
Chloride: 104 mmol/L (ref 98–111)
Creatinine, Ser: 0.39 mg/dL — ABNORMAL LOW (ref 0.44–1.00)
GFR, Estimated: >60 mL/min (ref >60)
Glucose, Bld: 178 mg/dL — ABNORMAL HIGH (ref 70–99)
Potassium: 3.6 mmol/L (ref 3.5–5.1)
Sodium: 135 mmol/L (ref 135–145)

## 2023-02-12 NOTE — Progress Notes (Signed)
Physical Therapy Treatment Patient Details Name: MAYDEAN REICHLE MRN: [id number] DOB: 1955-06-24 Today's Date: 02/12/2023   History of Present Illness 67 yo female s/p L DATHA 02/11/2023, and R DATHA was 2017. Was attempting to DC from PACU, however was vry nauseous with movement and L thigh and glut was still very numb. Admitted to the unit.    PT Comments  Pt motivated and progressing well with mobility.  Pt up to ambulate in hall, to bathroom for toileting, performed HEP with written instruction provided and reviewed; negotiated stairs, and reviewed car transfers.  Pt eager for dc home this date.    If plan is discharge home, recommend the following: A little help with walking and/or transfers;Assistance with cooking/housework;Assist for transportation;Help with stairs or ramp for entrance;A little help with bathing/dressing/bathroom   Can travel by private vehicle        Equipment Recommendations  None recommended by PT    Recommendations for Other Services       Precautions / Restrictions Precautions Precautions: None Restrictions Weight Bearing Restrictions: No     Mobility  Bed Mobility Overal bed mobility: Needs Assistance Bed Mobility: Supine to Sit     Supine to sit: Min assist     General bed mobility comments: in assist for L LE and VC fro sequence    Transfers Overall transfer level: Needs assistance Equipment used: Rolling walker (2 wheels) Transfers: Sit to/from Stand Sit to Stand: Supervision           General transfer comment: cues for LE management and use of UEs to self assist    Ambulation/Gait Ambulation/Gait assistance: Contact guard assist, Supervision Gait Distance (Feet): 150 Feet (and 15' back from bathroom) Assistive device: Rolling walker (2 wheels) Gait Pattern/deviations: Step-to pattern, Step-through pattern       General Gait Details: cues for posture and position from RW   Stairs Stairs: Yes Stairs assistance: Min  assist Stair Management: No rails, Step to pattern, Backwards, With walker Number of Stairs: 4 General stair comments: 2 steps twice wtih RW bkwd; cues for sequence, written instruction provided   Wheelchair Mobility     Tilt Bed    Modified Rankin (Stroke Patients Only)       Balance Overall balance assessment: Mild deficits observed, not formally tested                                          Cognition Arousal: Alert Behavior During Therapy: WFL for tasks assessed/performed Overall Cognitive Status: Within Functional Limits for tasks assessed                                          Exercises Total Joint Exercises Ankle Circles/Pumps: AROM, Both, 15 reps, Supine Quad Sets: AROM, Both, 10 reps, Supine Heel Slides: AAROM, Left, 15 reps, Supine Hip ABduction/ADduction: AAROM, Left, 15 reps, Supine Long Arc Quad: AAROM, Left, 10 reps, Seated    General Comments        Pertinent Vitals/Pain Pain Assessment Pain Assessment: 0-10 Pain Score: 4  Pain Location: L hip/thigh/buttocks Pain Descriptors / Indicators: Aching, Sore Pain Intervention(s): Limited activity within patient's tolerance, Monitored during session, Premedicated before session, Ice applied    Home Living  Prior Function            PT Goals (current goals can now be found in the care plan section) Acute Rehab PT Goals Patient Stated Goal: I want to be able to move around more with less pain PT Goal Formulation: With patient Time For Goal Achievement: 02/25/23 Potential to Achieve Goals: Good Progress towards PT goals: Progressing toward goals    Frequency    7X/week      PT Plan      Co-evaluation              AM-PAC PT "6 Clicks" Mobility   Outcome Measure  Help needed turning from your back to your side while in a flat bed without using bedrails?: None Help needed moving from lying on your back to  sitting on the side of a flat bed without using bedrails?: A Little Help needed moving to and from a bed to a chair (including a wheelchair)?: A Little Help needed standing up from a chair using your arms (e.g., wheelchair or bedside chair)?: A Little Help needed to walk in hospital room?: A Little Help needed climbing 3-5 steps with a railing? : A Little 6 Click Score: 19    End of Session Equipment Utilized During Treatment: Gait belt Activity Tolerance: Patient tolerated treatment well Patient left: in chair;with call bell/phone within reach;with chair alarm set Nurse Communication: Mobility status PT Visit Diagnosis: Other abnormalities of gait and mobility (R26.89)     Time: 0865-7846 PT Time Calculation (min) (ACUTE ONLY): 47 min  Charges:    $Gait Training: 8-22 mins $Therapeutic Exercise: 8-22 mins $Therapeutic Activity: 8-22 mins PT General Charges $$ ACUTE PT VISIT: 1 Visit                     Mauro Kaufmann PT Acute Rehabilitation Services Pager [phone] Office [phone]    Shadasia Oldfield 02/12/2023, 12:18 PM

## 2023-02-12 NOTE — TOC Transition Note (Signed)
Transition of Care [facility]) - CM/SW Discharge Note   Patient Details  Name: Karen Campos MRN: [id number] Date of Birth: 1955-05-22  [facility] of [facility] CM/SW Contact:  Amada Jupiter, LCSW Phone Number: 02/12/2023, 11:26 AM   Clinical Narrative:     Met with pt who confirms she has needed DME in the home.  Plan for HEP.  No TOC needs.  Final next level of care: Home/Self Care Barriers to Discharge: No Barriers Identified   Patient Goals and CMS Choice      Discharge Placement                         Discharge Plan and Services Additional resources added to the After Visit Summary for                  DME Arranged: N/A DME Agency: NA                  Social Determinants of Health (SDOH) Interventions SDOH Screenings   Food Insecurity: No Food Insecurity (02/11/2023)  Housing: Patient Declined (02/11/2023)  Transportation Needs: No Transportation Needs (02/11/2023)  Utilities: Not At Risk (02/11/2023)  Tobacco Use: Low Risk  (02/11/2023)     Readmission Risk Interventions     No data to display

## 2023-02-12 NOTE — Progress Notes (Signed)
Pt stated that while getting dressing with the NT she heard a pop around her left hip.  There was no additional pain.  The NT helped her walk around the room.  No additional pain.  Denny Peon PA was notified.  Stated that patient can go ahead with discharge since there was no additional pain.  I advised the patient to call Dr Kathline Magic office if she has any problems.

## 2023-02-12 NOTE — Discharge Summary (Signed)
Physician Discharge Summary  Patient ID: Karen Campos MRN: [id number] DOB/AGE: 1955-12-19 67 y.o.  Admit date: 02/11/2023 Discharge date: 02/12/2023  Admission Diagnoses:  Localized osteoarthrosis of left hip  Discharge Diagnoses:  Principal Problem:   Localized osteoarthrosis of left hip Active Problems:   S/P total hip arthroplasty   Past Medical History:  Diagnosis Date   Arthritis    Hx of adenomatous polyp of colon 10/03/2020   diminutive repeat 7-10 yrs   Hyperalbuminemia    mildy elevated - check yearly with CMP   Osteoporosis    Perimenopausal    Skin cancer    shoulder-2015   Wears glasses     Surgeries: Procedure(s): TOTAL HIP ARTHROPLASTY ANTERIOR APPROACH on 02/11/2023   Consultants (if any):   Discharged Condition: Improved  Hospital Course: Karen Campos is an 67 y.o. female who was admitted 02/11/2023 with a diagnosis of Localized osteoarthrosis of left hip and went to the operating room on 02/11/2023 and underwent the above named procedures.    She was given perioperative antibiotics:  Anti-infectives (From admission, onward)    Start     Dose/Rate Route Frequency Ordered Stop   02/11/23 1756  ceFAZolin (ANCEF) 2-4 GM/100ML-% IVPB       Note to Pharmacy: Roel Cluck A: cabinet override      02/11/23 1756 02/12/23 0614   02/11/23 1730  ceFAZolin (ANCEF) IVPB 2g/100 mL premix        2 g 200 mL/hr over 30 Minutes Intravenous Every 6 hours 02/11/23 1345 02/11/23 2357   02/11/23 0915  ceFAZolin (ANCEF) IVPB 2g/100 mL premix        2 g 200 mL/hr over 30 Minutes Intravenous On call to O.R. 02/11/23 0913 02/11/23 1138       She was given sequential compression devices, early ambulation, and Aspirin for DVT prophylaxis.  POD#1 Patient kept overnight due to nausea. No nausea reported in AM. Cleared PT. Discharged home with HEP.   She benefited maximally from the hospital stay and there were no complications.    Recent vital signs:   Vitals:   02/12/23 0531 02/12/23 0946  BP: 110/65 (!) 113/43  Pulse: 84 72  Resp: 16 15  Temp: 98.3 F (36.8 C) 98.3 F (36.8 C)  SpO2: 97% 100%    Recent laboratory studies:  Lab Results  Component Value Date   HGB 11.3 (L) 02/12/2023   HGB 14.6 02/02/2023   HGB 14.9 11/01/2018   Lab Results  Component Value Date   WBC 9.1 02/12/2023   PLT 164 02/12/2023   Lab Results  Component Value Date   INR 0.97 07/29/2015   Lab Results  Component Value Date   NA 135 02/12/2023   K 3.6 02/12/2023   CL 104 02/12/2023   CO2 23 02/12/2023   BUN 14 02/12/2023   CREATININE 0.39 (L) 02/12/2023   GLUCOSE 178 (H) 02/12/2023     Allergies as of 02/12/2023       Reactions   Penicillins Hives   Tolerated Cephalosporin Date: 02/11/23.          Medication List     STOP taking these medications    alendronate 70 MG tablet Commonly known as: FOSAMAX   aspirin 81 MG tablet Replaced by: aspirin 81 MG chewable tablet       TAKE these medications    aspirin 81 MG chewable tablet Commonly known as: Aspirin Childrens Chew 1 tablet (81 mg total) by mouth 2 (two) times daily  with a meal. Replaces: aspirin 81 MG tablet   CALCIUM + D PO Take 2 tablets by mouth at bedtime.   Centrum Silver Adult 50+ Tabs Take 1 tablet by mouth daily.   docusate sodium 100 MG capsule Commonly known as: Colace Take 1 capsule (100 mg total) by mouth 2 (two) times daily.   Estroven Menopause Relief Caps Take 1 capsule by mouth daily.   HYDROcodone-acetaminophen 5-325 MG tablet Commonly known as: NORCO/VICODIN Take 1 tablet by mouth every 4 (four) hours as needed for up to 7 days for moderate pain (pain score 4-6) or severe pain (pain score 7-10).   LUTEIN PO Take 1 capsule by mouth daily.   melatonin 5 MG Tabs Take 5 mg by mouth at bedtime as needed (sleep).   mupirocin ointment 2 % Commonly known as: BACTROBAN Apply 1 Application topically at bedtime.   OMEGA 3 500 PO Take  1,000 mg by mouth daily.   ondansetron 4 MG tablet Commonly known as: Zofran Take 1 tablet (4 mg total) by mouth every 8 (eight) hours as needed for nausea or vomiting.   Osteo Bi-Flex One Per Day Tabs Take 1 tablet by mouth 2 (two) times daily.   polyethylene glycol 17 g packet Commonly known as: MiraLax Take 17 g by mouth daily as needed for mild constipation or moderate constipation.   senna 8.6 MG Tabs tablet Commonly known as: SENOKOT Take 2 tablets (17.2 mg total) by mouth at bedtime for 15 days.               Discharge Care Instructions  (From admission, onward)           Start     Ordered   02/12/23 0000  Weight bearing as tolerated        02/12/23 0805   02/12/23 0000  Change dressing       Comments: Do not remove your dressing.   02/12/23 0805              WEIGHT BEARING   Weight bearing as tolerated with assist device (walker, cane, etc) as directed, use it as long as suggested by your surgeon or therapist, typically at least 4-6 weeks.   EXERCISES  Results after joint replacement surgery are often greatly improved when you follow the exercise, range of motion and muscle strengthening exercises prescribed by your doctor. Safety measures are also important to protect the joint from further injury. Any time any of these exercises cause you to have increased pain or swelling, decrease what you are doing until you are comfortable again and then slowly increase them. If you have problems or questions, call your caregiver or physical therapist for advice.   Rehabilitation is important following a joint replacement. After just a few days of immobilization, the muscles of the leg can become weakened and shrink (atrophy).  These exercises are designed to build up the tone and strength of the thigh and leg muscles and to improve motion. Often times heat used for twenty to thirty minutes before working out will loosen up your tissues and help with improving the  range of motion but do not use heat for the first two weeks following surgery (sometimes heat can increase post-operative swelling).   These exercises can be done on a training (exercise) mat, on the floor, on a table or on a bed. Use whatever works the best and is most comfortable for you.    Use music or television while you are exercising  so that the exercises are a pleasant break in your day. This will make your life better with the exercises acting as a break in your routine that you can look forward to.   Perform all exercises about fifteen times, three times per day or as directed.  You should exercise both the operative leg and the other leg as well.  Exercises include:   Quad Sets - Tighten up the muscle on the front of the thigh (Quad) and hold for 5-10 seconds.   Straight Leg Raises - With your knee straight (if you were given a brace, keep it on), lift the leg to 60 degrees, hold for 3 seconds, and slowly lower the leg.  Perform this exercise against resistance later as your leg gets stronger.  Leg Slides: Lying on your back, slowly slide your foot toward your buttocks, bending your knee up off the floor (only go as far as is comfortable). Then slowly slide your foot back down until your leg is flat on the floor again.  Angel Wings: Lying on your back spread your legs to the side as far apart as you can without causing discomfort.  Hamstring Strength:  Lying on your back, push your heel against the floor with your leg straight by tightening up the muscles of your buttocks.  Repeat, but this time bend your knee to a comfortable angle, and push your heel against the floor.  You may put a pillow under the heel to make it more comfortable if necessary.   A rehabilitation program following joint replacement surgery can speed recovery and prevent re-injury in the future due to weakened muscles. Contact your doctor or a physical therapist for more information on knee rehabilitation.     CONSTIPATION  Constipation is defined medically as fewer than three stools per week and severe constipation as less than one stool per week.  Even if you have a regular bowel pattern at home, your normal regimen is likely to be disrupted due to multiple reasons following surgery.  Combination of anesthesia, postoperative narcotics, change in appetite and fluid intake all can affect your bowels.   YOU MUST use at least one of the following options; they are listed in order of increasing strength to get the job done.  They are all available over the counter, and you may need to use some, POSSIBLY even all of these options:    Drink plenty of fluids (prune juice may be helpful) and high fiber foods Colace 100 mg by mouth twice a day  Senokot for constipation as directed and as needed Dulcolax (bisacodyl), take with full glass of water  Miralax (polyethylene glycol) once or twice a day as needed.  If you have tried all these things and are unable to have a bowel movement in the first 3-4 days after surgery call either your surgeon or your primary doctor.    If you experience loose stools or diarrhea, hold the medications until you stool forms back up.  If your symptoms do not get better within 1 week or if they get worse, check with your doctor.  If you experience "the worst abdominal pain ever" or develop nausea or vomiting, please contact the office immediately for further recommendations for treatment.   ITCHING:  If you experience itching with your medications, try taking only a single pain pill, or even half a pain pill at a time.  You can also use Benadryl over the counter for itching or also to help with sleep.  TED HOSE STOCKINGS:  Use stockings on both legs until for at least 2 weeks or as directed by physician office. They may be removed at night for sleeping.  MEDICATIONS:  See your medication summary on the "After Visit Summary" that nursing will review with you.  You may have some  home medications which will be placed on hold until you complete the course of blood thinner medication.  It is important for you to complete the blood thinner medication as prescribed.  PRECAUTIONS:  If you experience chest pain or shortness of breath - call 911 immediately for transfer to the hospital emergency department.   If you develop a fever greater that 101 F, purulent drainage from wound, increased redness or drainage from wound, foul odor from the wound/dressing, or calf pain - CONTACT YOUR SURGEON.                                                   FOLLOW-UP APPOINTMENTS:  If you do not already have a post-op appointment, please call the office for an appointment to be seen by your surgeon.  Guidelines for how soon to be seen are listed in your "After Visit Summary", but are typically between 1-4 weeks after surgery.  OTHER INSTRUCTIONS:   Knee Replacement:  Do not place pillow under knee, focus on keeping the knee straight while resting. CPM instructions: 0-90 degrees, 2 hours in the morning, 2 hours in the afternoon, and 2 hours in the evening. Place foam block, curve side up under heel at all times except when in CPM or when walking.  DO NOT modify, tear, cut, or change the foam block in any way.   MAKE SURE YOU:  Understand these instructions.  Get help right away if you are not doing well or get worse.    Thank you for letting us be a part of your medical care team.  It is a privilege we respect greatly.  We hope these instructions will help you stay on track for a fast and full recovery!   Diagnostic Studies: DG Pelvis Portable  Result Date: 02/11/2023 CLINICAL DATA:  Postop left hip arthroplasty. EXAM: PORTABLE PELVIS 1-2 VIEWS COMPARISON:  None Available. FINDINGS: Left hip arthroplasty in expected alignment. No periprosthetic lucency or fracture. Recent postsurgical change includes air and edema in the soft tissues. Previous right hip arthroplasty. IMPRESSION: Left hip  arthroplasty without immediate postoperative complication. Electronically Signed   By: Narda Rutherford M.D.   On: 02/11/2023 17:31   DG HIP PORT UNILAT WITH PELVIS 1V LEFT  Result Date: 02/11/2023 CLINICAL DATA:  Elective surgery. EXAM: DG HIP (WITH OR WITHOUT PELVIS) 1V PORT LEFT COMPARISON:  None Available. FINDINGS: Six fluoroscopic spot views of the pelvis and left hip obtained in the operating room. Sequential images during hip arthroplasty. Fluoroscopy time 12 seconds. Dose 1.3030 mGy. Previous right hip arthroplasty. IMPRESSION: Intraoperative fluoroscopy during left hip arthroplasty. Electronically Signed   By: Narda Rutherford M.D.   On: 02/11/2023 17:31   DG C-Arm 1-60 Min-No Report  Result Date: 02/11/2023 Fluoroscopy was utilized by the requesting physician.  No radiographic interpretation.   DG C-Arm 1-60 Min-No Report  Result Date: 02/11/2023 Fluoroscopy was utilized by the requesting physician.  No radiographic interpretation.    Disposition: Discharge disposition: 01-Home or Self Care       Discharge  Instructions     Call MD / Call 911   Complete by: As directed    If you experience chest pain or shortness of breath, CALL 911 and be transported to the hospital emergency room.  If you develope a fever above 101 F, pus (white drainage) or increased drainage or redness at the wound, or calf pain, call your surgeon's office.   Change dressing   Complete by: As directed    Do not remove your dressing.   Constipation Prevention   Complete by: As directed    Drink plenty of fluids.  Prune juice may be helpful.  You may use a stool softener, such as Colace (over the counter) 100 mg twice a day.  Use MiraLax (over the counter) for constipation as needed.   Diet - low sodium heart healthy   Complete by: As directed    Discharge instructions   Complete by: As directed    Elevate toes above nose. Use cryotherapy as needed for pain and swelling.   Driving restrictions    Complete by: As directed    No driving for 6 weeks   Increase activity slowly as tolerated   Complete by: As directed    Lifting restrictions   Complete by: As directed    No lifting for 6 weeks   Post-operative opioid taper instructions:   Complete by: As directed    POST-OPERATIVE OPIOID TAPER INSTRUCTIONS: It is important to wean off of your opioid medication as soon as possible. If you do not need pain medication after your surgery it is ok to stop day one. Opioids include: Codeine, Hydrocodone(Norco, Vicodin), Oxycodone(Percocet, oxycontin) and hydromorphone amongst others.  Long term and even short term use of opiods can cause: Increased pain response Dependence Constipation Depression Respiratory depression And more.  Withdrawal symptoms can include Flu like symptoms Nausea, vomiting And more Techniques to manage these symptoms Hydrate well Eat regular healthy meals Stay active Use relaxation techniques(deep breathing, meditating, yoga) Do Not substitute Alcohol to help with tapering If you have been on opioids for less than two weeks and do not have pain than it is ok to stop all together.  Plan to wean off of opioids This plan should start within one week post op of your joint replacement. Maintain the same interval or time between taking each dose and first decrease the dose.  Cut the total daily intake of opioids by one tablet each day Next start to increase the time between doses. The last dose that should be eliminated is the evening dose.      TED hose   Complete by: As directed    Use stockings (TED hose) for 2 weeks on both leg(s).  You may remove them at night for sleeping.   Weight bearing as tolerated   Complete by: As directed         Follow-up Information     Clois Dupes, PA-C. Schedule an appointment as soon as possible for a visit in 2 week(s).   Specialty: Orthopedic Surgery Why: For wound re-check Contact information: [address], [address] [place] [address] [phone]                  Signed: Clois Dupes 02/12/2023, 12:21 PM

## 2023-02-12 NOTE — Progress Notes (Signed)
    Subjective:  Patient reports pain as mild to moderate.  Denies N/V/CP/SOB/Abd pain. She reports some nausea yesterday but has improved. She is eager for d/c home today.   Objective:   VITALS:   Vitals:   02/11/23 1941 02/11/23 2253 02/12/23 0125 02/12/23 0531  BP: 124/61 (!) 113/59 (!) 109/59 110/65  Pulse: 61 70 70 84  Resp: 15 16 16 16   Temp: 98.3 F (36.8 C) 98.5 F (36.9 C) 98.8 F (37.1 C) 98.3 F (36.8 C)  TempSrc: Oral Oral Oral Oral  SpO2: 100% 97% 97% 97%  Weight:      Height:        NAD Neurologically intact ABD soft Neurovascular intact Sensation intact distally Intact pulses distally Dorsiflexion/Plantar flexion intact Incision: dressing C/D/I No cellulitis present Compartment soft   Lab Results  Component Value Date   WBC 9.1 02/12/2023   HGB 11.3 (L) 02/12/2023   HCT 32.1 (L) 02/12/2023   MCV 92.0 02/12/2023   PLT 164 02/12/2023   BMET    Component Value Date/Time   NA 135 02/12/2023 0318   NA 141 11/01/2018 1352   K 3.6 02/12/2023 0318   CL 104 02/12/2023 0318   CO2 23 02/12/2023 0318   GLUCOSE 178 (H) 02/12/2023 0318   BUN 14 02/12/2023 0318   BUN 16 11/01/2018 1352   CREATININE 0.39 (L) 02/12/2023 0318   CREATININE 0.55 09/30/2015 1552   CALCIUM 8.4 (L) 02/12/2023 0318   GFRNONAA >60 02/12/2023 0318     Assessment/Plan: 1 Day Post-Op   Principal Problem:   Localized osteoarthrosis of left hip Active Problems:   S/P total hip arthroplasty  ABLA. Hemoglobin 11.3. Continue to monitor.   WBAT with walker DVT ppx: Aspirin, SCDs, TEDS PO pain control PT/OT: PT to come by today.  Dispo:  D/c home once cleared with PT.    Clois Dupes, PA-C 02/12/2023, 8:02 AM   [facility]  [place] [address], [place], [place] [address] Phone: [phone] [email or web address] Facebook  [employer]

## 2023-02-15 ENCOUNTER — Encounter (HOSPITAL_COMMUNITY): Payer: Self-pay | Admitting: Orthopedic Surgery

## 2023-09-27 ENCOUNTER — Ambulatory Visit (INDEPENDENT_AMBULATORY_CARE_PROVIDER_SITE_OTHER): Payer: BC Managed Care – PPO | Admitting: Nurse Practitioner

## 2023-09-27 ENCOUNTER — Other Ambulatory Visit (HOSPITAL_COMMUNITY)
Admission: RE | Admit: 2023-09-27 | Discharge: 2023-09-27 | Disposition: A | Discharge status: Home / Self Care | Source: Ambulatory Visit | Attending: Nurse Practitioner | Admitting: Nurse Practitioner

## 2023-09-27 ENCOUNTER — Encounter: Payer: Self-pay | Admitting: Nurse Practitioner

## 2023-09-27 VITALS — BP 112/65 | HR 88 | Ht 63.6 in | Wt 123.8 lb

## 2023-09-27 DIAGNOSIS — Z780 Asymptomatic menopausal state: Secondary | ICD-10-CM

## 2023-09-27 DIAGNOSIS — Z124 Encounter for screening for malignant neoplasm of cervix: Secondary | ICD-10-CM

## 2023-09-27 DIAGNOSIS — Z01419 Encounter for gynecological examination (general) (routine) without abnormal findings: Secondary | ICD-10-CM

## 2023-09-27 DIAGNOSIS — Z1331 Encounter for screening for depression: Secondary | ICD-10-CM | POA: Diagnosis not present

## 2023-09-27 DIAGNOSIS — M810 Age-related osteoporosis without current pathological fracture: Secondary | ICD-10-CM | POA: Diagnosis not present

## 2023-09-27 NOTE — Progress Notes (Signed)
 Karen Tobin [facility] [birth year] [id number]   History:  68 y.o. G2P2 presents for annual exam. Postmenopausal - no HRT, no bleeding. Normal pap history. H/O osteoporosis - was on Fosamax  since 2020, stopped for hip surgery 01/2023. Ortho recommended she stop use for 1 year post surgery.   Gynecologic History Patient's last menstrual period was 04/30/2008.   Contraception: post menopausal status Sexually active: Yes  Health Maintenance Last Pap: 11/01/2018. Results were: Normal neg HPV Last mammogram: 12/21/2022. Results were: Normal Last colonoscopy: 10/03/2020. Results were: adenoma, 7-10 years Last Dexa: 01/25/2023. Results were: T-score -2.1     09/27/2023   11:53 AM  Depression screen PHQ 2/9  Decreased Interest 0  Down, Depressed, Hopeless 0  PHQ - 2 Score 0     Past medical history, past surgical history, family history and social history were all reviewed and documented in the EPIC chart. Married. 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 Husband is retired. 2 children, 3 grandchildren. 56 yo mother living with her.   ROS:  A ROS was performed and pertinent positives and negatives are included.  Exam:  Vitals:   09/27/23 1138  BP: 112/65  Pulse: 88  SpO2: 98%  Weight: 123 lb 12.8 oz (56.2 kg)  Height: 5' 3.6 (1.615 m)   Body mass index is 21.52 kg/m.  Assessment/Plan:  68 y.o. G2P2 for annual exam.   General appearance:  Normal Thyroid :  Symmetrical, normal in size, without palpable masses or nodularity. Respiratory  Auscultation:  Clear without wheezing or rhonchi Cardiovascular  Auscultation:  Regular rate, without rubs, murmurs or gallops  Edema/varicosities:  Not grossly evident Abdominal  Soft,nontender, without masses, guarding or rebound.  Liver/spleen:  No organomegaly noted  Hernia:  None appreciated  Skin  Inspection:  Grossly normal Breasts: Examined lying and sitting.   Right: Without masses, retractions, nipple discharge or axillary  adenopathy.   Left: Without masses, retractions, nipple discharge or axillary adenopathy. Pelvic: External genitalia:  no lesions              Urethra:  normal appearing urethra with no masses, tenderness or lesions              Bartholins and Skenes: normal                 Vagina: normal appearing vagina with normal color and discharge, no lesions. Atrophic changes              Cervix: no lesions Bimanual Exam:  Uterus:  no masses or tenderness              Adnexa: no mass, fullness, tenderness              Rectovaginal: Deferred              Anus:  normal, no lesions  Elenor Mole performed exam with observation.   Assessment/Plan:  68 y.o. G2P2 for annual exam.   Well female exam with routine gynecological exam - Education provided on SBEs, importance of preventative screenings, current guidelines, high calcium  diet, regular exercise, and multivitamin daily. Labs with PCP.   Postmenopausal - no HRT, no bleeding  Age-related osteoporosis without current pathological fracture - 12/2022 T-score -2.1. Was on Fosamax  2020-2024. Stopped for hip surgery 01/2023. Ortho recommended she stop use for 1 year post surgery.   Cervical cancer screening - Plan: Cytology - PAP( [facility]). Normal pap history.   Screening for breast cancer - Normal mammogram history.  Continue annual screenings.  Normal breast exam today.  Screening for colon cancer - 2022 colonoscopy. Will repeat at 7-10 year interval per GI's recommendation.   Return in about 1 year (around 09/26/2024) for Annual.   Karen Campos Shutter DNP, 12:21 PM 09/27/2023

## 2023-09-29 ENCOUNTER — Ambulatory Visit: Payer: Self-pay | Admitting: Nurse Practitioner

## 2023-09-29 LAB — CYTOLOGY - PAP
Comment: NEGATIVE
Diagnosis: NEGATIVE
High risk HPV: NEGATIVE

## 2023-12-27 LAB — HM MAMMOGRAPHY

## 2023-12-28 ENCOUNTER — Encounter: Payer: Self-pay | Admitting: Nurse Practitioner

## 2024-01-10 ENCOUNTER — Other Ambulatory Visit (HOSPITAL_COMMUNITY): Payer: Self-pay | Admitting: Family Medicine

## 2024-01-10 DIAGNOSIS — I708 Atherosclerosis of other arteries: Secondary | ICD-10-CM

## 2024-01-25 ENCOUNTER — Ambulatory Visit (HOSPITAL_COMMUNITY)
Admission: RE | Admit: 2024-01-25 | Discharge: 2024-01-25 | Disposition: A | Discharge status: Home / Self Care | Payer: Self-pay | Source: Ambulatory Visit | Attending: Family Medicine | Admitting: Family Medicine

## 2024-01-25 DIAGNOSIS — I708 Atherosclerosis of other arteries: Secondary | ICD-10-CM | POA: Insufficient documentation

## 2024-01-31 ENCOUNTER — Ambulatory Visit (HOSPITAL_BASED_OUTPATIENT_CLINIC_OR_DEPARTMENT_OTHER): Payer: Self-pay

## 2024-02-01 DIAGNOSIS — M5441 Lumbago with sciatica, right side: Secondary | ICD-10-CM | POA: Diagnosis not present

## 2024-02-01 DIAGNOSIS — M5416 Radiculopathy, lumbar region: Secondary | ICD-10-CM | POA: Diagnosis not present

## 2024-02-01 DIAGNOSIS — G8929 Other chronic pain: Secondary | ICD-10-CM | POA: Diagnosis not present

## 2024-02-07 DIAGNOSIS — M5416 Radiculopathy, lumbar region: Secondary | ICD-10-CM | POA: Diagnosis not present

## 2024-02-07 DIAGNOSIS — M5441 Lumbago with sciatica, right side: Secondary | ICD-10-CM | POA: Diagnosis not present

## 2024-02-07 DIAGNOSIS — G8929 Other chronic pain: Secondary | ICD-10-CM | POA: Diagnosis not present

## 2024-02-09 DIAGNOSIS — M25511 Pain in right shoulder: Secondary | ICD-10-CM | POA: Diagnosis not present

## 2024-02-11 DIAGNOSIS — M9902 Segmental and somatic dysfunction of thoracic region: Secondary | ICD-10-CM | POA: Diagnosis not present

## 2024-02-11 DIAGNOSIS — M5414 Radiculopathy, thoracic region: Secondary | ICD-10-CM | POA: Diagnosis not present

## 2024-02-18 DIAGNOSIS — M25511 Pain in right shoulder: Secondary | ICD-10-CM | POA: Diagnosis not present

## 2024-03-02 DIAGNOSIS — M25511 Pain in right shoulder: Secondary | ICD-10-CM | POA: Diagnosis not present

## 2024-04-06 ENCOUNTER — Encounter: Payer: Self-pay | Admitting: Cardiology

## 2024-04-06 ENCOUNTER — Ambulatory Visit: Attending: Cardiology | Admitting: Cardiology

## 2024-04-06 ENCOUNTER — Other Ambulatory Visit (HOSPITAL_COMMUNITY): Payer: Self-pay

## 2024-04-06 VITALS — BP 150/92 | HR 54 | Resp 16 | Ht 63.0 in | Wt 132.1 lb

## 2024-04-06 DIAGNOSIS — I493 Ventricular premature depolarization: Secondary | ICD-10-CM | POA: Diagnosis not present

## 2024-04-06 DIAGNOSIS — R931 Abnormal findings on diagnostic imaging of heart and coronary circulation: Secondary | ICD-10-CM | POA: Insufficient documentation

## 2024-04-06 DIAGNOSIS — R011 Cardiac murmur, unspecified: Secondary | ICD-10-CM

## 2024-04-06 DIAGNOSIS — Z8249 Family history of ischemic heart disease and other diseases of the circulatory system: Secondary | ICD-10-CM | POA: Diagnosis not present

## 2024-04-06 DIAGNOSIS — E7800 Pure hypercholesterolemia, unspecified: Secondary | ICD-10-CM

## 2024-04-06 LAB — LIPID PANEL
Chol/HDL Ratio: 1.8 ratio (ref 0.0–4.4)
Cholesterol, Total: 210 mg/dL — ABNORMAL HIGH (ref 100–199)
HDL: 120 mg/dL
LDL Chol Calc (NIH): 76 mg/dL (ref 0–99)
Triglycerides: 82 mg/dL (ref 0–149)
VLDL Cholesterol Cal: 14 mg/dL (ref 5–40)

## 2024-04-06 MED ORDER — ATORVASTATIN CALCIUM 10 MG PO TABS
10.0000 mg | ORAL_TABLET | Freq: Every day | ORAL | 3 refills | Status: AC
  Filled 2024-04-06: qty 90, 90d supply, fill #0

## 2024-04-06 NOTE — Patient Instructions (Addendum)
 Medication Instructions:  START atorvastatin  (LIPITOR) 10 MG tablet         Take 1 tablet (10 mg total) by mouth daily   *If you need a refill on your cardiac medications before your next appointment, please call your pharmacy*  Lab Work:   (TODAY) Lab Orders         Apo A1 + B + Ratio         Lipoprotein A (LPA)     LIPIDS (6 WEEKS - Around 2/19-2/26)  If you have labs (blood work) drawn today and your tests are completely normal, you will receive your results only by: MyChart Message (if you have MyChart) OR A paper copy in the mail If you have any lab test that is abnormal or we need to change your treatment, we will call you to review the results.  Testing/Procedures: ECHOCARDIOGRAM  Your physician has requested that you have an echocardiogram. Echocardiography is a painless test that uses sound waves to create images of your heart. It provides your doctor with information about the size and shape of your heart and how well your hearts chambers and valves are working. This procedure takes approximately one hour. There are no restrictions for this procedure. Please do NOT wear cologne, perfume, aftershave, or lotions (deodorant is allowed). Please arrive 15 minutes prior to your appointment time.  Please note: We ask at that you not bring children with you during ultrasound (echo/ vascular) testing. Due to room size and safety concerns, children are not allowed in the ultrasound rooms during exams. Our front office staff cannot provide observation of children in our lobby area while testing is being conducted. An adult accompanying a patient to their appointment will only be allowed in the ultrasound room at the discretion of the ultrasound technician under special circumstances. We apologize for any inconvenience.   EXERCISE TOLERANCE TEST (ETT)   You are scheduled for an Exercise Stress Test on _______________ at ____________.  Please arrive 15 minutes prior to your appointment  time for registration and insurance purposes.  The test will take approximately 45 minutes to complete.  How to prepare for your Exercise Stress Test: Do bring a list of your current medications with you.  If not listed below, you may take your medications as normal. Do wear comfortable clothes (no dresses or overalls) and walking shoes, tennis shoes preferred (no heels or open toed shoes are allowed) Do Not wear cologne, perfume, aftershave or lotions (deodorant is allowed). Please report to [address] for your test.  If these instructions are not followed, your test will have to be rescheduled.  If you have questions or concerns about your appointment, you can call the Stress Lab at [phone].  If you cannot keep your appointment, please provide 24 hours notification to the Stress Lab, to avoid a possible $50 charge to your account.  Follow-Up: At [facility], you and your health needs are our priority.  As part of our continuing mission to provide you with exceptional heart care, our providers are all part of one team.  This team includes your primary Cardiologist (physician) and Advanced Practice Providers or APPs (Physician Assistants and Nurse Practitioners) who all work together to provide you with the care you need, when you need it.  Your next appointment:   June 12, 2024 @ 11am  Provider:   Gordy Bergamo, MD          We recommend signing up for the patient portal called  MyChart.  Patients are able to view lab/test results, encounter notes, upcoming appointments, etc.  Non-urgent messages can be sent to your provider as well, go to [email or web address].

## 2024-04-06 NOTE — Progress Notes (Signed)
 " Cardiology Office Note:  .   Date:  04/06/2024  ID:  Karen Campos, DOB July 17, 1955, MRN [id number] PCP: Sun, Vyvyan, MD  [facility] Providers Cardiologist:  Gordy Bergamo, MD   History of Present Illness: Karen Campos is a 69 y.o. Caucasian female patient with no significant cardiovascular history referred to me for evaluation of elevated coronary calcium  score performed on 01/25/2024, total score of 204 in the 85th percentile.  Patient is a retired midwife, recently retired.  She is active but does not have an exercise program.  She has occasional skipped beats especially when she is laying at rest.  No chest pain or dyspnea.  Accompanied by her husband.    Discussed the use of AI scribe software for clinical note transcription with the patient, who gave verbal consent to proceed.  History of Present Illness Karen Campos is a 69 year old female who presents with elevated coronary calcium  score and heart palpitations. She is accompanied by her husband, Laure Leone.  She has a strong family history of cardiovascular disease, with her father dying of a heart attack before age 46 and her mother having atrial fibrillation. A recent coronary calcium  score was 204, in the 85th percentile.  She has intermittent palpitations, including an episode where she felt her heart did a flip flop in her chest. She also has occasional indigestion that resolves with antacids, without associated nausea, vomiting, or sweating.  Her blood pressure in clinic is 150/92, though she reports usual home readings around 130 systolic.  She is less active since retiring in June, now walking about a mile a day and occasionally doing exercise videos.  Cardiac Studies relevent.    Coronary calcium  score 01/25/2024: Left main: 48.2  Left anterior descending artery: 156 Left circumflex artery: 0 Right coronary artery: 0 Total score of 204, MESA database percentile 85. Gas/fluid levels in  a patulous esophagus suggestive of GERD.  EKG:   EKG Interpretation Date/Time:  Thursday April 06 2024 14:24:05 EST Ventricular Rate:  77 PR Interval:  162 QRS Duration:  84 QT Interval:  388 QTC Calculation: 439 R Axis:   -21  Text Interpretation: EKG 04/06/2024: Normal sinus rhythm at rate of 77 bpm with frequent PVCs and quadrigeminal pattern.  No prior EKG to compare. Confirmed by Harlowe Dowler, Jagadeesh [phone]) on 04/06/2024 2:48:08 PM  Labs   Care everywhere/Faxed External Labs:  Labs 11/15/2023:  Total cholesterol 209, triglycerides 103, HDL 91, LDL 100.  ROS  Review of Systems  Cardiovascular:  Positive for palpitations (occasional brief). Negative for chest pain, dyspnea on exertion and leg swelling.   Physical Exam:   VS:  BP (!) 150/92 (BP Location: Left Arm, Patient Position: Sitting, Cuff Size: Normal)   Pulse (!) 54   Resp 16   Ht 5' 3 (1.6 m)   Wt 132 lb 1.6 oz (59.9 kg)   LMP 04/30/2008   SpO2 97%   BMI 23.40 kg/m    Wt Readings from Last 3 Encounters:  04/06/24 132 lb 1.6 oz (59.9 kg)  09/27/23 123 lb 12.8 oz (56.2 kg)  02/11/23 127 lb (57.6 kg)    BP Readings from Last 3 Encounters:  04/06/24 (!) 150/92  09/27/23 112/65  02/12/23 (!) 113/43   Physical Exam Neck:     Vascular: No JVD.  Cardiovascular:     Rate and Rhythm: Normal rate and regular rhythm.     Pulses: Intact distal pulses.  Heart sounds: S1 normal and S2 normal. A midsystolic click. Murmur heard.     Mid to late systolic murmur is present with a grade of 2/6 at the apex.     No gallop.  Pulmonary:     Effort: Pulmonary effort is normal.     Breath sounds: Normal breath sounds.  Abdominal:     General: Bowel sounds are normal.     Palpations: Abdomen is soft.  Musculoskeletal:     Right lower leg: No edema.     Left lower leg: No edema.     ASSESSMENT AND PLAN: .      ICD-10-CM   1. Elevated coronary artery calcium  score 01/25/2024: Total score of 204, MESA database  percentile 85.  R93.1 EKG 12-Lead    Apo A1 + B + Ratio    Lipoprotein A (LPA)    atorvastatin  (LIPITOR) 10 MG tablet    Cardiac Stress Test: Informed Consent Details: Physician/Practitioner Attestation; Transcribe to consent form and obtain patient signature    EXERCISE TOLERANCE TEST (ETT)    Lipid panel    2. PVC (premature ventricular contraction)  I49.3 ECHOCARDIOGRAM COMPLETE    Cardiac Stress Test: Informed Consent Details: Physician/Practitioner Attestation; Transcribe to consent form and obtain patient signature    EXERCISE TOLERANCE TEST (ETT)    3. Murmur, cardiac  R01.1 ECHOCARDIOGRAM COMPLETE    4. Family history of premature CAD: Father with MI age 73 Y  Z45.49 atorvastatin  (LIPITOR) 10 MG tablet    Cardiac Stress Test: Informed Consent Details: Physician/Practitioner Attestation; Transcribe to consent form and obtain patient signature    EXERCISE TOLERANCE TEST (ETT)    5. 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 Mild aortic atherosclerosis present. Family history of premature coronary artery disease. Cholesterol levels are not significantly elevated, but plaque buildup is present. Preventive measures are being taken to reduce the risk of future cardiovascular events. - Ordered treadmill stress test - Ordered echocardiogram to evaluate cardiac murmur and frequent PVCs - Prescribed low-dose cholesterol medication (Lipitor 10 mg) - Ordered special cholesterol tests: Apo A1 to B ratio and LPA as a baseline today - Scheduled follow-up lipid panel before next visit  Premature ventricular contractions Occasional PVCs noted on EKG. These may be benign extra heartbeats but require monitoring due to family history of heart disease. -  Ordered echocardiogram to evaluate PVCs - GXT to evaluate for inducible arrhythmias and for myocardial ischemia.  If PVCs reduced with exertion, PVCs would be considered benign.  Cardiac murmur Late systolic heart murmur detected. Differential includes mitral valve prolapse. - Ordered echocardiogram to evaluate cardiac murmur  Hypercholesterolemia Cholesterol levels are not significantly elevated, but preventive treatment is recommended due to family history and plaque buildup (coronary calcium  score). - Prescribed low-dose cholesterol medication (Lipitor 10 mg) - Scheduled follow-up lipid panel before next visit  Follow up: APP or with me in 2 months for Hyperchol, F/U GXT and Echo and labs.  If LDL <70 and no significant abnormalities on echo, no significant Lp(a) elevation, patient can be seen as needed.  Signed,  Gordy Bergamo, MD, [facility] 04/06/2024, 5:47 PM [facility] [address] [place], [place] [address] Phone: [phone]. Fax:  [phone]  "

## 2024-04-07 ENCOUNTER — Ambulatory Visit: Payer: Self-pay | Admitting: Cardiology

## 2024-04-07 DIAGNOSIS — E7800 Pure hypercholesterolemia, unspecified: Secondary | ICD-10-CM

## 2024-04-07 LAB — APO A1 + B + RATIO
Apolipo. B/A-1 Ratio: 0.3 ratio (ref 0.0–0.6)
Apolipoprotein A-1: 251 mg/dL — ABNORMAL HIGH (ref 116–209)
Apolipoprotein B: 79 mg/dL

## 2024-04-07 LAB — LIPOPROTEIN A (LPA): Lipoprotein (a): 115.8 nmol/L — ABNORMAL HIGH

## 2024-04-07 NOTE — Progress Notes (Signed)
 Apo A1 plus B ratio is excellent with low risk.  Lipid profile is normal.  Lp(a) is mildly elevated.  Agree with continuing low-dose of atorvastatin  10 mg daily, I have placed repeat lab orders which you need to do 3 to 4 days prior to seeing me.

## 2024-05-04 ENCOUNTER — Telehealth (HOSPITAL_COMMUNITY): Payer: Self-pay | Admitting: *Deleted

## 2024-05-04 NOTE — Telephone Encounter (Signed)
 Spoke with patient for upcoming GXT on 05/12/2024 at 10:30.  Patient also has an echocardiogram at 9:00 on the same day and will arrive at 8:40 for that appointment.

## 2024-05-12 ENCOUNTER — Encounter (HOSPITAL_COMMUNITY)

## 2024-05-12 ENCOUNTER — Ambulatory Visit (HOSPITAL_COMMUNITY)

## 2024-06-12 ENCOUNTER — Ambulatory Visit: Admitting: Cardiology

## 2024-09-28 ENCOUNTER — Ambulatory Visit: Admitting: Nurse Practitioner
